# Patient Record
Sex: Female | Born: 1985 | Race: White | Hispanic: No | Marital: Married | Smoking: Former smoker
Health system: Southern US, Community
[De-identification: ages and names within clinical notes are randomized; demographics above are authoritative.]

## PROBLEM LIST (undated history)

## (undated) DIAGNOSIS — R87629 Unspecified abnormal cytological findings in specimens from vagina: Secondary | ICD-10-CM

## (undated) DIAGNOSIS — F1190 Opioid use, unspecified, uncomplicated: Secondary | ICD-10-CM

## (undated) DIAGNOSIS — D696 Thrombocytopenia, unspecified: Secondary | ICD-10-CM

## (undated) DIAGNOSIS — B009 Herpesviral infection, unspecified: Secondary | ICD-10-CM

## (undated) DIAGNOSIS — O24419 Gestational diabetes mellitus in pregnancy, unspecified control: Secondary | ICD-10-CM

## (undated) DIAGNOSIS — O99119 Other diseases of the blood and blood-forming organs and certain disorders involving the immune mechanism complicating pregnancy, unspecified trimester: Secondary | ICD-10-CM

## (undated) HISTORY — DX: Gestational diabetes mellitus in pregnancy, unspecified control: O24.419

## (undated) HISTORY — DX: Other diseases of the blood and blood-forming organs and certain disorders involving the immune mechanism complicating pregnancy, unspecified trimester: O99.119

## (undated) HISTORY — PX: LEEP: SHX91

## (undated) HISTORY — PX: EYE SURGERY: SHX253

## (undated) HISTORY — PX: WRIST SURGERY: SHX841

## (undated) HISTORY — DX: Unspecified abnormal cytological findings in specimens from vagina: R87.629

## (undated) HISTORY — DX: Herpesviral infection, unspecified: B00.9

## (undated) HISTORY — DX: Other diseases of the blood and blood-forming organs and certain disorders involving the immune mechanism complicating pregnancy, unspecified trimester: D69.6

## (undated) HISTORY — DX: Opioid use, unspecified, uncomplicated: F11.90

---

## 2010-05-29 DIAGNOSIS — G40909 Epilepsy, unspecified, not intractable, without status epilepticus: Secondary | ICD-10-CM | POA: Insufficient documentation

## 2017-10-28 ENCOUNTER — Other Ambulatory Visit: Payer: Self-pay | Admitting: Specialist

## 2017-10-28 DIAGNOSIS — G44229 Chronic tension-type headache, not intractable: Secondary | ICD-10-CM

## 2017-10-28 DIAGNOSIS — H532 Diplopia: Secondary | ICD-10-CM

## 2017-11-05 ENCOUNTER — Ambulatory Visit
Admission: RE | Admit: 2017-11-05 | Discharge: 2017-11-05 | Disposition: A | Discharge status: Home / Self Care | Payer: PRIVATE HEALTH INSURANCE | Source: Ambulatory Visit | Attending: Specialist | Admitting: Specialist

## 2017-11-05 DIAGNOSIS — H532 Diplopia: Secondary | ICD-10-CM

## 2017-11-05 DIAGNOSIS — G44229 Chronic tension-type headache, not intractable: Secondary | ICD-10-CM

## 2017-11-05 IMAGING — MR MR HEAD W/O CM
11 series · 48 of 48 positions shown · non-contrast
Comparison: None.

CLINICAL DATA: Blurred vision. Bilateral temporal lobe headaches.
Brain follow-up. Symptoms began [DATE]. Diagnosis with lines
disease 1 year previous. Chronic attention type headache, not
intractable. Diplopia.

EXAM:
MRI HEAD WITHOUT CONTRAST
TECHNIQUE: Multiplanar, multiecho pulse sequences of the brain and surrounding
structures were obtained without intravenous contrast.

[Series 2: T1 · sagittal · 5.0mm · 0.45mm/px · 2 of 25 slices shown]
[im 1/25]
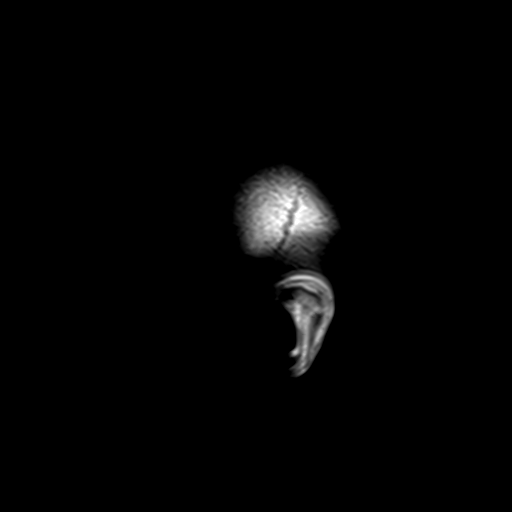
[im 25/25]
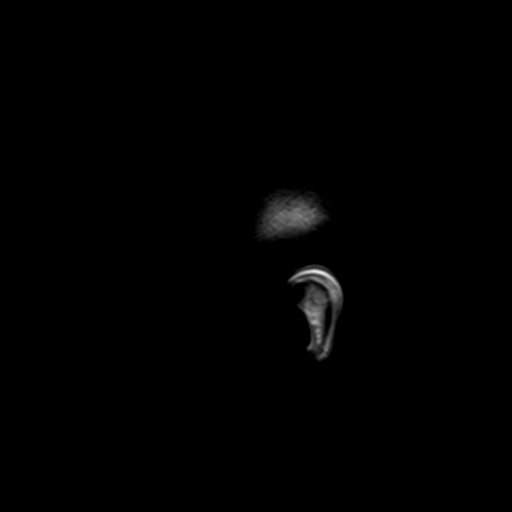

[Series 3: DWI · axial · 3.0mm · 1.80mm/px · z∈[-72,+86]mm · 8 of 108 slices shown (1 of 4)]
[im 1/108]
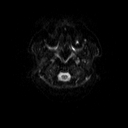
[im 16/108]
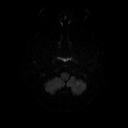
[im 31/108]
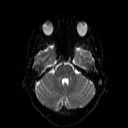
[im 46/108]
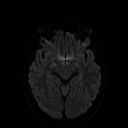
[im 62/108]
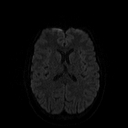
[im 77/108]
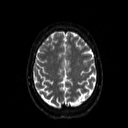
[im 92/108]
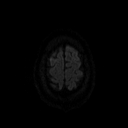
[im 108/108]
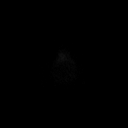

[Series 4: DWI · axial · 3.0mm · 1.80mm/px · z∈[-72,+86]mm · 4 of 54 slices shown (2 of 4)]
[im 1/54]
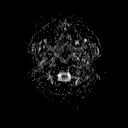
[im 18/54]
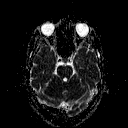
[im 36/54]
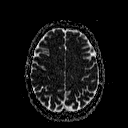
[im 54/54]
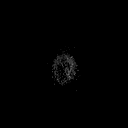

[Series 5: FLAIR · axial · 3.0mm · 0.45mm/px · z∈[-69,+83]mm · 3 of 34 slices shown]
[im 1/34]
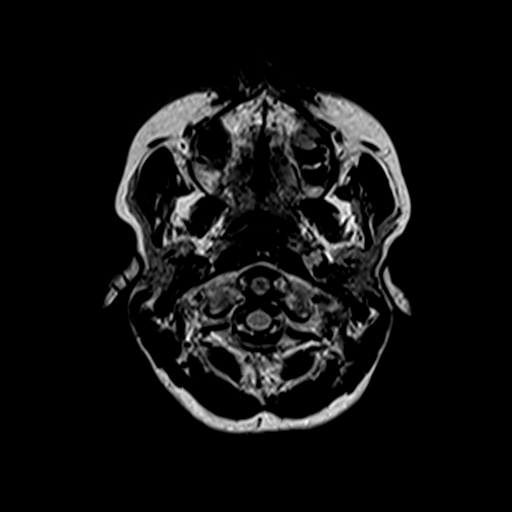
[im 17/34]
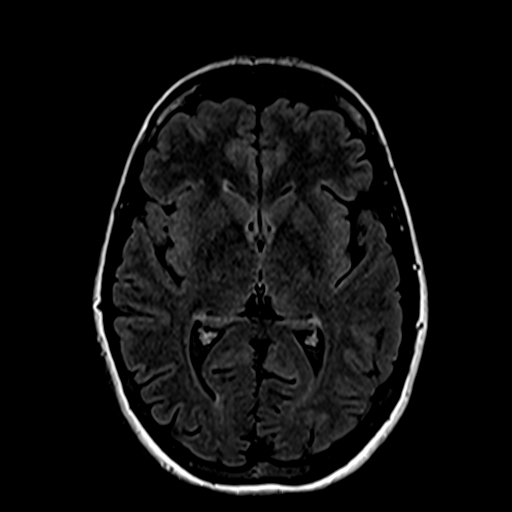
[im 34/34]
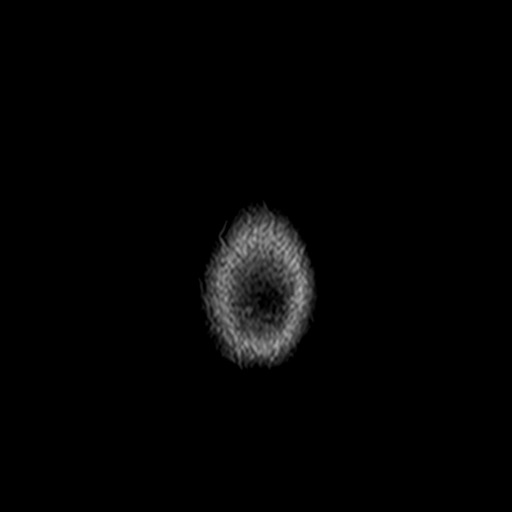

[Series 6: T2 · axial · 5.0mm · 0.60mm/px · z∈[-69,+85]mm · 2 of 24 slices shown (1 of 2)]
[im 1/24]
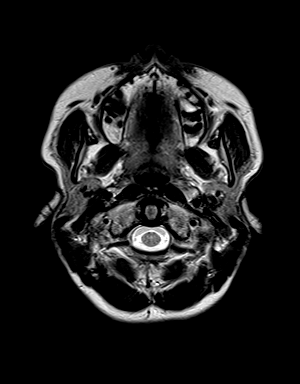
[im 24/24]
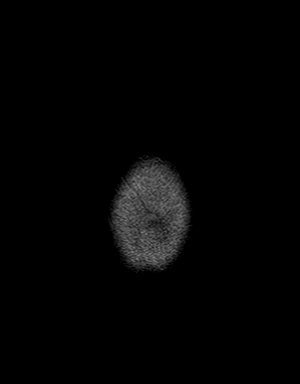

[Series 7: mip_images(sw) · axial · 40.0mm · 0.90mm/px · z∈[-52,+67]mm · 2 of 25 slices shown]
[im 1/25]
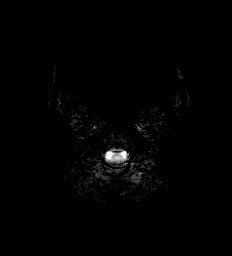
[im 25/25]
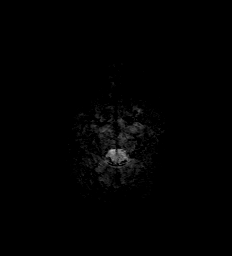

[Series 8: swi_images · axial · 5.0mm · 0.90mm/px · z∈[-69,+84]mm · 3 of 32 slices shown]
[im 1/32]
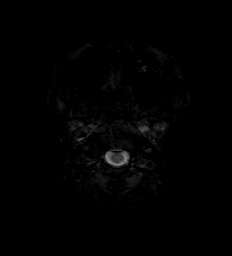
[im 16/32]
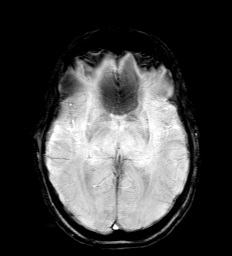
[im 32/32]
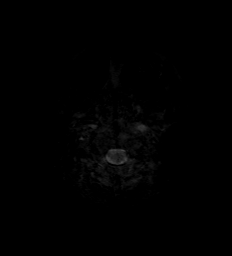

[Series 9: t1_mpr_tra · axial · 1.0mm · 0.72mm/px · z∈[-70,+87]mm · 13 of 160 slices shown]
[im 1/160]
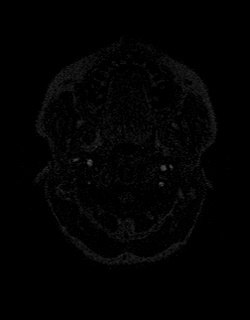
[im 14/160]
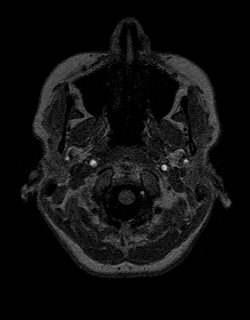
[im 27/160]
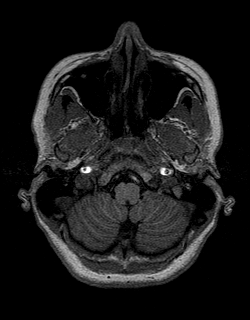
[im 40/160]
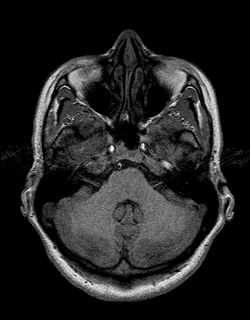
[im 54/160]
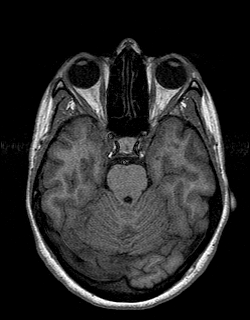
[im 67/160]
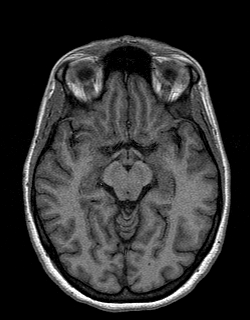
[im 80/160]
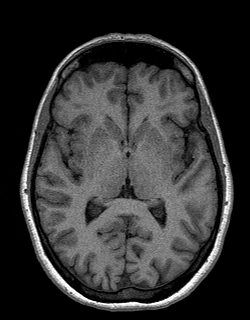
[im 93/160]
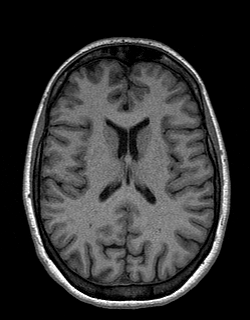
[im 107/160]
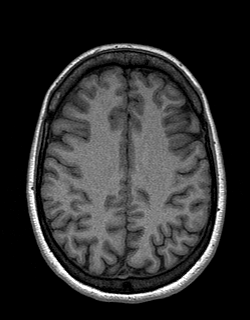
[im 120/160]
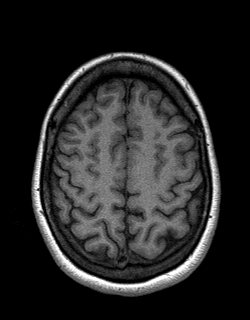
[im 133/160]
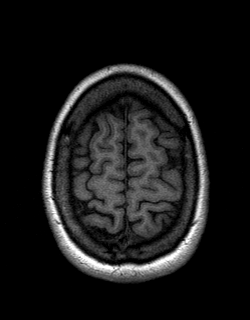
[im 146/160]
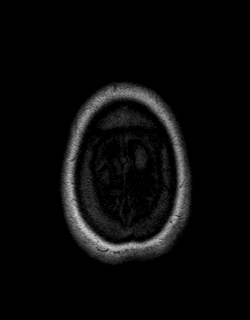
[im 160/160]
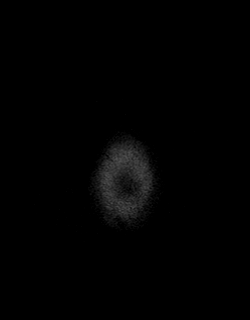

[Series 10: DWI · coronal · 5.0mm · 1.80mm/px · 6 of 80 slices shown (3 of 4)]
[im 1/80]
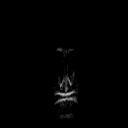
[im 16/80]
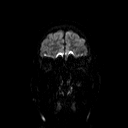
[im 32/80]
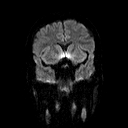
[im 48/80]
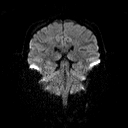
[im 64/80]
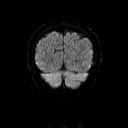
[im 80/80]
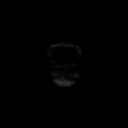

[Series 11: DWI · coronal · 5.0mm · 1.80mm/px · 3 of 40 slices shown (4 of 4)]
[im 1/40]
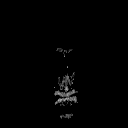
[im 20/40]
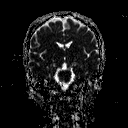
[im 40/40]
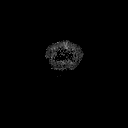

[Series 12: T2 · coronal · 5.0mm · 0.45mm/px · 2 of 31 slices shown (2 of 2)]
[im 1/31]
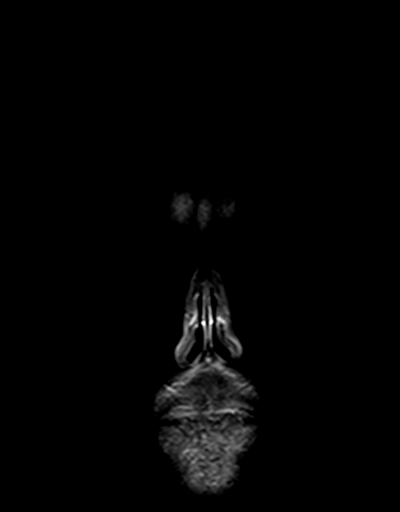
[im 31/31]
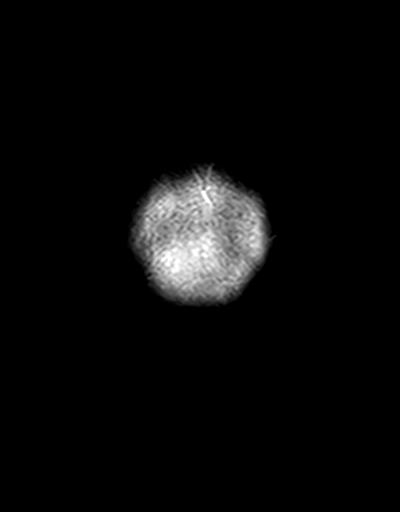

[48 of 48 positions shown; findings below may reference images not displayed]

FINDINGS: Brain: No acute infarct, hemorrhage, or mass lesion is present. No
significant extraaxial fluid collection is present. The ventricles
are of normal size. No significant white matter disease is present.

Vascular: Flow is present in the major intracranial arteries.

Skull and upper cervical spine: The skullbase is within normal
limits. The craniocervical junction is within normal limits. Midline
sagittal structures are unremarkable.

Sinuses/Orbits: A polyp or a this retention cyst is noted anteriorly
and inferiorly in the left maxillary sinus. The remaining paranasal
sinuses an the mastoid air cells are clear. Globes and orbits are
within normal limits bilaterally.
IMPRESSION: Negative MRI of the brain. No acute or focal lesion to explain the
patient's symptoms.

## 2018-12-26 DIAGNOSIS — A6000 Herpesviral infection of urogenital system, unspecified: Secondary | ICD-10-CM | POA: Insufficient documentation

## 2018-12-30 DIAGNOSIS — O99320 Drug use complicating pregnancy, unspecified trimester: Secondary | ICD-10-CM | POA: Insufficient documentation

## 2018-12-30 DIAGNOSIS — F1410 Cocaine abuse, uncomplicated: Secondary | ICD-10-CM | POA: Insufficient documentation

## 2018-12-30 DIAGNOSIS — F1111 Opioid abuse, in remission: Secondary | ICD-10-CM | POA: Insufficient documentation

## 2019-04-07 DIAGNOSIS — O99810 Abnormal glucose complicating pregnancy: Secondary | ICD-10-CM | POA: Insufficient documentation

## 2019-04-10 DIAGNOSIS — D508 Other iron deficiency anemias: Secondary | ICD-10-CM | POA: Insufficient documentation

## 2019-04-10 DIAGNOSIS — D696 Thrombocytopenia, unspecified: Secondary | ICD-10-CM | POA: Insufficient documentation

## 2019-12-10 ENCOUNTER — Encounter (HOSPITAL_COMMUNITY): Payer: Self-pay | Admitting: Emergency Medicine

## 2019-12-10 ENCOUNTER — Other Ambulatory Visit: Payer: Self-pay

## 2019-12-10 ENCOUNTER — Emergency Department (HOSPITAL_COMMUNITY)
Admission: EM | Admit: 2019-12-10 | Discharge: 2019-12-11 | Discharge status: Against Medical Advice | Payer: Medicaid Other | Attending: Emergency Medicine | Admitting: Emergency Medicine

## 2019-12-10 DIAGNOSIS — Y929 Unspecified place or not applicable: Secondary | ICD-10-CM | POA: Diagnosis not present

## 2019-12-10 DIAGNOSIS — S52501A Unspecified fracture of the lower end of right radius, initial encounter for closed fracture: Secondary | ICD-10-CM

## 2019-12-10 DIAGNOSIS — S59911A Unspecified injury of right forearm, initial encounter: Secondary | ICD-10-CM | POA: Diagnosis present

## 2019-12-10 DIAGNOSIS — Y999 Unspecified external cause status: Secondary | ICD-10-CM | POA: Diagnosis not present

## 2019-12-10 DIAGNOSIS — S52591A Other fractures of lower end of right radius, initial encounter for closed fracture: Secondary | ICD-10-CM | POA: Diagnosis not present

## 2019-12-10 DIAGNOSIS — W109XXA Fall (on) (from) unspecified stairs and steps, initial encounter: Secondary | ICD-10-CM | POA: Insufficient documentation

## 2019-12-10 DIAGNOSIS — S52691A Other fracture of lower end of right ulna, initial encounter for closed fracture: Secondary | ICD-10-CM | POA: Insufficient documentation

## 2019-12-10 DIAGNOSIS — Y939 Activity, unspecified: Secondary | ICD-10-CM | POA: Insufficient documentation

## 2019-12-10 DIAGNOSIS — Z5320 Procedure and treatment not carried out because of patient's decision for unspecified reasons: Secondary | ICD-10-CM | POA: Insufficient documentation

## 2019-12-10 DIAGNOSIS — S52601A Unspecified fracture of lower end of right ulna, initial encounter for closed fracture: Secondary | ICD-10-CM

## 2019-12-10 MED ORDER — OXYCODONE-ACETAMINOPHEN 5-325 MG PO TABS
1.0000 | ORAL_TABLET | Freq: Once | ORAL | Status: AC
  Administered 2019-12-10: 1 via ORAL
  Filled 2019-12-10: qty 1

## 2019-12-10 NOTE — ED Triage Notes (Signed)
Patient missed her step and fell at home today , seen at [facility] ER , presents with right forearm splint with sling , sustained fracture at right wrist , reports pain at right wrist .

## 2019-12-11 ENCOUNTER — Emergency Department (HOSPITAL_COMMUNITY): Payer: Medicaid Other

## 2019-12-11 DIAGNOSIS — S52509A Unspecified fracture of the lower end of unspecified radius, initial encounter for closed fracture: Secondary | ICD-10-CM | POA: Insufficient documentation

## 2019-12-11 MED ORDER — ACETAMINOPHEN 500 MG PO TABS
1000.0000 mg | ORAL_TABLET | Freq: Once | ORAL | Status: AC
  Administered 2019-12-11: 1000 mg via ORAL
  Filled 2019-12-11: qty 2

## 2019-12-11 NOTE — ED Provider Notes (Signed)
[facility] EMERGENCY DEPARTMENT Provider Note   CSN: [id number] Arrival date & time: 12/10/19  2044     History Chief Complaint  Patient presents with  . Wrist Fracture    Aimee Guzman is a 34 y.o. female with a history of opioid use disorder on buprenorphine who presents to the emergency department with a chief complaint of fall.  The patient reports that she fell down she tripped and fell down a flight of stairs yesterday and landed on her right side.  She reports that her right arm was outstretched.  She denies hitting her head, nausea, vomiting, or syncope.  The patient went to [facility] ER and sustained comminuted, displaced, nonarticular fractures of the distal right radius and ulna.  She reports that she was supposed to go to [facility] as she might need to have surgery.  She reports that her grandfather was driving her to [place], but was unable to drive her to [facility] because it was too far.  She reports that her husband then drove her to the ER tonight.  Per chart review, it appears that orthopedics was consulted by [facility] who had requested the patient to be transferred to [facility] for fracture reduction and splint application.  The patient does have a splint and sling in place in the ER tonight.  She reports that she was taking buprenorphine, but has stopped this medication.  She is unable to tell me when she stopped taking this medication.  She is requesting medication for pain.  The history is provided by the patient. No language interpreter was used.       History reviewed. No pertinent past medical history.  There are no problems to display for this patient.   History reviewed. No pertinent surgical history.   OB History   No obstetric history on file.     No family history on file.  Social History   Tobacco Use  . Smoking status: Never Smoker  . Smokeless tobacco: Never Used  Substance Use Topics  . Alcohol use: Never  . Drug use: Never    Home  Medications Prior to Admission medications   Not on File    Allergies    Patient has no known allergies.  Review of Systems   Review of Systems  Constitutional: Negative for activity change, chills and fever.  Respiratory: Negative for shortness of breath.   Cardiovascular: Negative for chest pain.  Gastrointestinal: Negative for abdominal pain, diarrhea, nausea and vomiting.  Genitourinary: Negative for dysuria.  Musculoskeletal: Positive for arthralgias and myalgias. Negative for back pain, gait problem, neck pain and neck stiffness.  Skin: Negative for rash.  Allergic/Immunologic: Negative for immunocompromised state.  Neurological: Negative for dizziness, seizures, syncope, weakness and headaches.  Psychiatric/Behavioral: Negative for confusion.    Physical Exam Updated Vital Signs BP 124/84   Pulse 84   Temp 98.5 F (36.9 C) (Oral)   Resp 18   LMP 11/30/2019   SpO2 100%   Physical Exam Vitals and nursing note reviewed.  Constitutional:      General: She is not in acute distress. HENT:     Head: Normocephalic.  Eyes:     Conjunctiva/sclera: Conjunctivae normal.  Cardiovascular:     Rate and Rhythm: Normal rate and regular rhythm.     Heart sounds: No murmur. No friction rub. No gallop.   Pulmonary:     Effort: Pulmonary effort is normal. No respiratory distress.  Abdominal:     General: There is no distension.  Palpations: Abdomen is soft.  Musculoskeletal:     Cervical back: Neck supple.     Comments: Splint in place to the right arm.  Radial pulses are 2+ and palpable.  Good capillary refill of the digits of the right hand.  Sensation is intact and equal to all 4 of the distal aspects digits of the right hand.  Skin:    General: Skin is warm.     Findings: No rash.  Neurological:     Mental Status: She is alert.  Psychiatric:        Behavior: Behavior normal.     ED Results / Procedures / Treatments   Labs (all labs ordered are listed, but only  abnormal results are displayed) Labs Reviewed - No data to display  EKG None  Radiology No results found.  Procedures Procedures (including critical care time)  Medications Ordered in ED Medications  oxyCODONE-acetaminophen (PERCOCET/ROXICET) 5-325 MG per tablet 1 tablet (1 tablet Oral Given 12/10/19 2116)  acetaminophen (TYLENOL) tablet 1,000 mg (1,000 mg Oral Given 12/11/19 0201)    ED Course  I have reviewed the triage vital signs and the nursing notes.  Pertinent labs & imaging results that were available during my care of the patient were reviewed by me and considered in my medical decision making (see chart for details).  Clinical Course as of Dec 10 313  Mon Dec 11, 2019  0300 Notified by nursing staff that patient has eloped from the department.  Unfortunately, I was unable to discuss concerns for leaving AMA prior to elopement.   [MM]    Clinical Course User Index [MM] Fishel Wamble, Coral Else, PA-C   MDM Rules/Calculators/A&P                      34 year old female with a history of opioid use disorder on buprenorphine who presents to the emergency department with a chief complaint of fall.  The patient tripped and fell down the stairs yesterday and was seen at [facility] ER where she was found to have comminuted, nonarticular, displaced fractures of the right distal radius and ulna.  She was supposed to be transferred by POV to [facility] ER for fracture reduction and splint application, but states that she did not have transportation to the [facility].   Medical records from [facility] ER have been reviewed.  Impressions of the images are available in care everywhere, but the images are not able to be viewed.  Will reorder right wrist x-ray and plan to consult orthopedics following imaging.  I discussed with the patient that this will take some time since orthopedics is not aware of the patient and we do not have images available for viewing.    Will order Tylenol for pain control as the patient  is requesting pain medication after discussing the patient with Dr. Daun Peacock, attending physician.  We discussed her buprenorphine, which she had filled on January 5 for 30 days.  She states that she is no longer taking this medication, but is unable to tell me how long she has not been taking it.  Per [place] controlled substance database, she has been getting the medication filled regularly since February 2020.  Notified by nursing staff that the patient eloped from the department. I was unable to discuss my concerns as a provider and the possibility that this may worsen due to elopement. We were unable to discuss the nature, risks and benefits, and alternatives to treatment. I was also unable tospecifically discussed that  without further evaluation I cannot guarantee there is not a life threatening event occuring.    Final Clinical Impression(s) / ED Diagnoses Final diagnoses:  Closed fracture of distal end of right radius, unspecified fracture morphology, initial encounter  Closed fracture of distal end of right ulna, unspecified fracture morphology, initial encounter    Rx / DC Orders ED Discharge Orders    None       Barkley Boards, PA-C 12/11/19 0315    Palumbo, April, MD 12/11/19 0330

## 2019-12-11 NOTE — ED Notes (Signed)
Patient transported to X-ray 

## 2020-01-30 DIAGNOSIS — S52551D Other extraarticular fracture of lower end of right radius, subsequent encounter for closed fracture with routine healing: Secondary | ICD-10-CM | POA: Diagnosis not present

## 2020-01-30 DIAGNOSIS — S52691D Other fracture of lower end of right ulna, subsequent encounter for closed fracture with routine healing: Secondary | ICD-10-CM | POA: Diagnosis not present

## 2020-02-22 DIAGNOSIS — R41840 Attention and concentration deficit: Secondary | ICD-10-CM | POA: Diagnosis not present

## 2020-02-22 DIAGNOSIS — F419 Anxiety disorder, unspecified: Secondary | ICD-10-CM | POA: Diagnosis not present

## 2020-02-22 DIAGNOSIS — G44229 Chronic tension-type headache, not intractable: Secondary | ICD-10-CM | POA: Diagnosis not present

## 2020-02-27 DIAGNOSIS — S52551D Other extraarticular fracture of lower end of right radius, subsequent encounter for closed fracture with routine healing: Secondary | ICD-10-CM | POA: Diagnosis not present

## 2020-02-27 DIAGNOSIS — S52691D Other fracture of lower end of right ulna, subsequent encounter for closed fracture with routine healing: Secondary | ICD-10-CM | POA: Diagnosis not present

## 2020-03-04 DIAGNOSIS — Z23 Encounter for immunization: Secondary | ICD-10-CM | POA: Diagnosis not present

## 2020-03-15 DIAGNOSIS — G894 Chronic pain syndrome: Secondary | ICD-10-CM | POA: Diagnosis not present

## 2020-03-25 DIAGNOSIS — Z23 Encounter for immunization: Secondary | ICD-10-CM | POA: Diagnosis not present

## 2020-07-25 DIAGNOSIS — R41840 Attention and concentration deficit: Secondary | ICD-10-CM | POA: Diagnosis not present

## 2020-07-25 DIAGNOSIS — Z760 Encounter for issue of repeat prescription: Secondary | ICD-10-CM | POA: Diagnosis not present

## 2020-07-25 DIAGNOSIS — G44229 Chronic tension-type headache, not intractable: Secondary | ICD-10-CM | POA: Diagnosis not present

## 2020-07-25 DIAGNOSIS — F419 Anxiety disorder, unspecified: Secondary | ICD-10-CM | POA: Diagnosis not present

## 2020-08-13 DIAGNOSIS — F1120 Opioid dependence, uncomplicated: Secondary | ICD-10-CM | POA: Diagnosis not present

## 2020-09-27 DIAGNOSIS — F1120 Opioid dependence, uncomplicated: Secondary | ICD-10-CM | POA: Diagnosis not present

## 2020-09-30 DIAGNOSIS — F1120 Opioid dependence, uncomplicated: Secondary | ICD-10-CM | POA: Diagnosis not present

## 2020-11-06 DIAGNOSIS — F1120 Opioid dependence, uncomplicated: Secondary | ICD-10-CM | POA: Diagnosis not present

## 2020-11-14 DIAGNOSIS — Z01419 Encounter for gynecological examination (general) (routine) without abnormal findings: Secondary | ICD-10-CM | POA: Diagnosis not present

## 2020-11-20 DIAGNOSIS — F1120 Opioid dependence, uncomplicated: Secondary | ICD-10-CM | POA: Diagnosis not present

## 2020-12-26 DIAGNOSIS — G44229 Chronic tension-type headache, not intractable: Secondary | ICD-10-CM | POA: Diagnosis not present

## 2020-12-26 DIAGNOSIS — R41840 Attention and concentration deficit: Secondary | ICD-10-CM | POA: Diagnosis not present

## 2020-12-26 DIAGNOSIS — F419 Anxiety disorder, unspecified: Secondary | ICD-10-CM | POA: Diagnosis not present

## 2020-12-26 DIAGNOSIS — Z760 Encounter for issue of repeat prescription: Secondary | ICD-10-CM | POA: Diagnosis not present

## 2021-02-28 DIAGNOSIS — Z8639 Personal history of other endocrine, nutritional and metabolic disease: Secondary | ICD-10-CM | POA: Diagnosis not present

## 2021-02-28 DIAGNOSIS — Z8619 Personal history of other infectious and parasitic diseases: Secondary | ICD-10-CM | POA: Diagnosis not present

## 2021-02-28 DIAGNOSIS — Z1331 Encounter for screening for depression: Secondary | ICD-10-CM | POA: Diagnosis not present

## 2021-02-28 DIAGNOSIS — Z0000 Encounter for general adult medical examination without abnormal findings: Secondary | ICD-10-CM | POA: Diagnosis not present

## 2021-03-07 DIAGNOSIS — Z8619 Personal history of other infectious and parasitic diseases: Secondary | ICD-10-CM | POA: Diagnosis not present

## 2021-04-29 DIAGNOSIS — J029 Acute pharyngitis, unspecified: Secondary | ICD-10-CM | POA: Diagnosis not present

## 2021-04-29 DIAGNOSIS — R0981 Nasal congestion: Secondary | ICD-10-CM | POA: Diagnosis not present

## 2021-05-08 DIAGNOSIS — J0120 Acute ethmoidal sinusitis, unspecified: Secondary | ICD-10-CM | POA: Diagnosis not present

## 2021-05-15 DIAGNOSIS — G44229 Chronic tension-type headache, not intractable: Secondary | ICD-10-CM | POA: Diagnosis not present

## 2021-05-15 DIAGNOSIS — F419 Anxiety disorder, unspecified: Secondary | ICD-10-CM | POA: Diagnosis not present

## 2021-05-15 DIAGNOSIS — Z760 Encounter for issue of repeat prescription: Secondary | ICD-10-CM | POA: Diagnosis not present

## 2021-05-15 DIAGNOSIS — R41840 Attention and concentration deficit: Secondary | ICD-10-CM | POA: Diagnosis not present

## 2021-07-12 DIAGNOSIS — F1120 Opioid dependence, uncomplicated: Secondary | ICD-10-CM | POA: Diagnosis not present

## 2021-07-22 DIAGNOSIS — J0100 Acute maxillary sinusitis, unspecified: Secondary | ICD-10-CM | POA: Diagnosis not present

## 2021-08-12 DIAGNOSIS — F1120 Opioid dependence, uncomplicated: Secondary | ICD-10-CM | POA: Diagnosis not present

## 2021-08-18 DIAGNOSIS — Z3689 Encounter for other specified antenatal screening: Secondary | ICD-10-CM | POA: Diagnosis not present

## 2021-08-18 DIAGNOSIS — O99321 Drug use complicating pregnancy, first trimester: Secondary | ICD-10-CM | POA: Diagnosis not present

## 2021-08-18 DIAGNOSIS — Z23 Encounter for immunization: Secondary | ICD-10-CM | POA: Diagnosis not present

## 2021-08-18 DIAGNOSIS — F1290 Cannabis use, unspecified, uncomplicated: Secondary | ICD-10-CM | POA: Diagnosis not present

## 2021-08-19 DIAGNOSIS — O3680X0 Pregnancy with inconclusive fetal viability, not applicable or unspecified: Secondary | ICD-10-CM | POA: Diagnosis not present

## 2021-08-19 DIAGNOSIS — Z3A01 Less than 8 weeks gestation of pregnancy: Secondary | ICD-10-CM | POA: Diagnosis not present

## 2021-09-01 DIAGNOSIS — F1120 Opioid dependence, uncomplicated: Secondary | ICD-10-CM | POA: Diagnosis not present

## 2021-09-02 DIAGNOSIS — Z3689 Encounter for other specified antenatal screening: Secondary | ICD-10-CM | POA: Diagnosis not present

## 2021-09-02 LAB — OB RESULTS CONSOLE RUBELLA ANTIBODY, IGM: Rubella: IMMUNE

## 2021-10-10 DIAGNOSIS — F1120 Opioid dependence, uncomplicated: Secondary | ICD-10-CM | POA: Diagnosis not present

## 2021-10-13 DIAGNOSIS — R350 Frequency of micturition: Secondary | ICD-10-CM | POA: Diagnosis not present

## 2021-10-16 DIAGNOSIS — F1120 Opioid dependence, uncomplicated: Secondary | ICD-10-CM | POA: Diagnosis not present

## 2021-10-31 DIAGNOSIS — F1120 Opioid dependence, uncomplicated: Secondary | ICD-10-CM | POA: Diagnosis not present

## 2021-11-13 DIAGNOSIS — Z369 Encounter for antenatal screening, unspecified: Secondary | ICD-10-CM | POA: Diagnosis not present

## 2021-11-13 DIAGNOSIS — F1111 Opioid abuse, in remission: Secondary | ICD-10-CM | POA: Diagnosis not present

## 2021-11-13 DIAGNOSIS — O4402 Placenta previa specified as without hemorrhage, second trimester: Secondary | ICD-10-CM | POA: Insufficient documentation

## 2021-11-13 DIAGNOSIS — Z3A18 18 weeks gestation of pregnancy: Secondary | ICD-10-CM | POA: Diagnosis not present

## 2021-11-13 DIAGNOSIS — O4442 Low lying placenta NOS or without hemorrhage, second trimester: Secondary | ICD-10-CM | POA: Insufficient documentation

## 2021-11-13 DIAGNOSIS — O3503X0 Maternal care for (suspected) central nervous system malformation or damage in fetus, choroid plexus cysts, not applicable or unspecified: Secondary | ICD-10-CM | POA: Diagnosis not present

## 2021-11-13 DIAGNOSIS — Z3689 Encounter for other specified antenatal screening: Secondary | ICD-10-CM | POA: Diagnosis not present

## 2021-11-14 DIAGNOSIS — F1120 Opioid dependence, uncomplicated: Secondary | ICD-10-CM | POA: Diagnosis not present

## 2021-11-25 DIAGNOSIS — R41840 Attention and concentration deficit: Secondary | ICD-10-CM | POA: Diagnosis not present

## 2021-11-25 DIAGNOSIS — Z760 Encounter for issue of repeat prescription: Secondary | ICD-10-CM | POA: Diagnosis not present

## 2021-11-25 DIAGNOSIS — F419 Anxiety disorder, unspecified: Secondary | ICD-10-CM | POA: Diagnosis not present

## 2021-11-25 DIAGNOSIS — G44229 Chronic tension-type headache, not intractable: Secondary | ICD-10-CM | POA: Diagnosis not present

## 2021-11-28 DIAGNOSIS — F1120 Opioid dependence, uncomplicated: Secondary | ICD-10-CM | POA: Diagnosis not present

## 2021-11-30 NOTE — L&D Delivery Note (Addendum)
Delivery Note:  ? 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   ? ?First Stage: ? ?Induction of labor: 04/08/22 initiated with cytotec @ 1730.  ?Onset of labor: 04/09/22 at 0902 ?Augmentation: AROM, Pitocin, and Cytotec ?ROM: AROM by CNM at 0902 ?Active labor onset: 04/09/22 at 1020 AM  ?Analgesia /Anesthesia/Pain control intrapartum: Epidural  ? ?Second Stage: ? ?Complete dilation at 04/09/2022  1045 AM ?Onset of pushing at 1045 AM ?FHR second stage: Category II variables with quick return to baseline; Prolonged x1 ? ? ?CNM to bedside for re-occurring variable decelerations with contractions. SVE revealed Aimee Guzman to be complete at 0 station. Pushing initiated immediately with CNM and RNs at bedside. Pt Pushing in lithotomy position with good maternal effort. Fetal head progressing to +1 station with pushing efforts. CNM requested [place] to bedside. Pt repositioned to Right tilt and resting between contractions.   position with CNM and L&D staff support at bedside. Dr. [name] and Dr. [name] to at bedside for delivery. FHT in prolonged decel down to 80's. Dr. [name] assessed position of the baby and called for a vacuum delivery. NICU requested to bedside.  FOB present for birth and supportive. ?Nuchal Cord: No  ?Delivery of a Live born female  ?Birth Weight:   ?APGAR: 8, 9 ? ?Newborn Delivery   ?Birth date/time: 04/09/2022 11:02:00 ?Delivery type: Vaginal, Vacuum (Extractor) ?  ?  ? Please see Dr. [name] Note below for details of delivery.  ?APGAR: 5 min:  8  ?APGAR:10 min-  9 ? ?CNM Cord double clamped 1 minute of life and cut by FOB.  ?Collection of cord blood for typing completed. ?Cord blood donation-None  ?Arterial cord blood sample-No   ? ?Third Stage: ? ?Brisk vaginal bleeding noted prior to placental separation. TXA ordered and initiated last platelet count was 94.  ?With LUS  massage and gentle cord traction [name] Placenta delivered-Spontaneous  with 3 vessels . ?Uterine tone firmed with massage bleeding decreased to minimal.   ?Uterotonics: TXA and Pitocin Bolus  ?Placenta to L&D for disposal. ? ?2nd degree;Periurethral  laceration identified.  ?Episiotomy:None  ?Local analgesia: N/A  ? ?Repair: 2nd degree repaired in traditional fashion with 3.0 vicryl. Left Periurethral identified and hemostatic. Not Repaired  ?Est. Blood Loss (mL):500.00   ?Complications: None ? ? ?Mom to postpartum.  Baby girl "Aimee Guzman" to Couplet care / Skin to Skin. ? ?Delivery Report: ? ?Review the Delivery Report for details.   ? ?[name]) [name], MSN, CNM  ?Center for [facility]  ?04/09/22 11:12AM ? ?Faculty practice Vacuum Assisted Vaginal Delivery Note ? ?Indication for operative vaginal delivery: Fetal bradycardia ? ?Patient was examined and found to be fully dilated with fetal station of +1 .  Patient's bladder was noted to be empty, and there were no known fetal contraindications to operative vaginal delivery. EFW was 59%ile by [place]/recent ultrasound.  FHR tracing remarkable for persistent recurrent variable decelerations followed by prolonged decelerations in the 90s. ? ?Risks of vacuum assistance were discussed in detail, including but not limited to, bleeding, infection, damage to maternal tissues, fetal cephalohematoma, inability to effect vaginal delivery of the head or shoulder dystocia that cannot be resolved by established maneuvers and need for emergency cesarean section.  Patient gave verbal consent. ? ?The soft vacuum cup was positioned over the sagittal suture 3 cm anterior to posterior fontanelle.  Pressure  was then increased to 500 mmHg, and the patient was instructed to push.  Pulling was administered along the pelvic curve while patient was pushing; there were 2 contractions and 0 popoffs.  Vacuum was reduced in between contractions.  The infant head was then delivered  atraumatically. At that time anterior shoulder was appreciated to be behind pelvic bone and a shoulder dystocia was diagnosed. At that time McRoberts maneuver as well as suprapubic pressure was done and simultaneously the baby's posterior axilla was palpated and posterior shoulder was delivered with the body shortly following. Noted to be a viable female infant, Apgars of ___ and ___.   ? ? ?Neonatology present for delivery.  There was spontaneous placental delivery, intact with three-vessel cord.   ? ?Vacuum delivery done by Dr. [name] and Dr. [name] present in room for delivery and directly supervised. ? ?Care then passed on to [name], [place] as documented below. ? ?Second degree perineal laceration noted requiring repair with 3.0 vicryl in the usual fashion. EBL 500cc, epidural anesthesia.  Sponge, instrument and needle counts were correct x 2.   ? ?Patient was given TXA at delivery for brisk bleeding as well as known gestational thrombocytopenia (Plt 94). ? ?The patient and baby were stable after delivery and remained in couplet care, with plans to transfer later to postpartum unit ? ?[name], MD, MPH ?OB Fellow, Faculty Practice ?Center for [employer], [facility] ? ?

## 2021-12-11 DIAGNOSIS — Z362 Encounter for other antenatal screening follow-up: Secondary | ICD-10-CM | POA: Diagnosis not present

## 2021-12-11 DIAGNOSIS — Z363 Encounter for antenatal screening for malformations: Secondary | ICD-10-CM | POA: Diagnosis not present

## 2021-12-11 DIAGNOSIS — Z3A22 22 weeks gestation of pregnancy: Secondary | ICD-10-CM | POA: Diagnosis not present

## 2021-12-11 DIAGNOSIS — O359XX0 Maternal care for (suspected) fetal abnormality and damage, unspecified, not applicable or unspecified: Secondary | ICD-10-CM | POA: Diagnosis not present

## 2021-12-11 DIAGNOSIS — O3503X0 Maternal care for (suspected) central nervous system malformation or damage in fetus, choroid plexus cysts, not applicable or unspecified: Secondary | ICD-10-CM | POA: Diagnosis not present

## 2021-12-11 DIAGNOSIS — Z3689 Encounter for other specified antenatal screening: Secondary | ICD-10-CM | POA: Diagnosis not present

## 2021-12-11 DIAGNOSIS — O4442 Low lying placenta NOS or without hemorrhage, second trimester: Secondary | ICD-10-CM | POA: Diagnosis not present

## 2021-12-12 DIAGNOSIS — F1120 Opioid dependence, uncomplicated: Secondary | ICD-10-CM | POA: Diagnosis not present

## 2021-12-26 DIAGNOSIS — F1120 Opioid dependence, uncomplicated: Secondary | ICD-10-CM | POA: Diagnosis not present

## 2021-12-31 ENCOUNTER — Telehealth: Payer: Self-pay

## 2021-12-31 DIAGNOSIS — O0990 Supervision of high risk pregnancy, unspecified, unspecified trimester: Secondary | ICD-10-CM

## 2022-01-01 NOTE — Telephone Encounter (Addendum)
Referral records sent to [facility] office. Per chart review, pt has history of OUD and is currently taking Naltrexone. Patient reports following with [facility] for Naltrexone. Reports taking Suboxone up until positive pregnancy test, reports this was approx August 2022. States she had a negative experience with first pregnancy; states she felt very shamed during prenatal care and delivery due to OUD. Recommended patient follow up with [place] location. Explained that we have experience with pregnant women with OUD and our providers are very supportive. New OB scheduled. Pt needs follow up US due to low lying placenta. [place] scheduled with MFM. Pt notified via [place].

## 2022-01-09 ENCOUNTER — Encounter: Payer: Self-pay | Admitting: *Deleted

## 2022-01-09 DIAGNOSIS — F1120 Opioid dependence, uncomplicated: Secondary | ICD-10-CM | POA: Diagnosis not present

## 2022-01-15 ENCOUNTER — Ambulatory Visit: Payer: BC Managed Care – PPO | Attending: Family Medicine

## 2022-01-15 ENCOUNTER — Other Ambulatory Visit: Payer: Self-pay

## 2022-01-15 ENCOUNTER — Other Ambulatory Visit: Payer: Self-pay | Admitting: *Deleted

## 2022-01-15 ENCOUNTER — Ambulatory Visit: Payer: BC Managed Care – PPO | Admitting: *Deleted

## 2022-01-15 ENCOUNTER — Encounter: Payer: Self-pay | Admitting: *Deleted

## 2022-01-15 VITALS — BP 106/62 | HR 88 | Ht 66.0 in

## 2022-01-15 DIAGNOSIS — O99323 Drug use complicating pregnancy, third trimester: Secondary | ICD-10-CM

## 2022-01-15 DIAGNOSIS — F1120 Opioid dependence, uncomplicated: Secondary | ICD-10-CM

## 2022-01-15 DIAGNOSIS — Z3493 Encounter for supervision of normal pregnancy, unspecified, third trimester: Secondary | ICD-10-CM

## 2022-01-15 DIAGNOSIS — O0990 Supervision of high risk pregnancy, unspecified, unspecified trimester: Secondary | ICD-10-CM | POA: Diagnosis not present

## 2022-01-15 DIAGNOSIS — O4440 Low lying placenta NOS or without hemorrhage, unspecified trimester: Secondary | ICD-10-CM | POA: Insufficient documentation

## 2022-01-15 IMAGING — US US MFM OB DETAIL+14 WK
1 series · 13 of 28 positions shown · non-contrast
Comparison: none

[Series 1: us mfm ob detail+14 wk · 136 acquisitions, 13 frames shown]
[im 6/136]
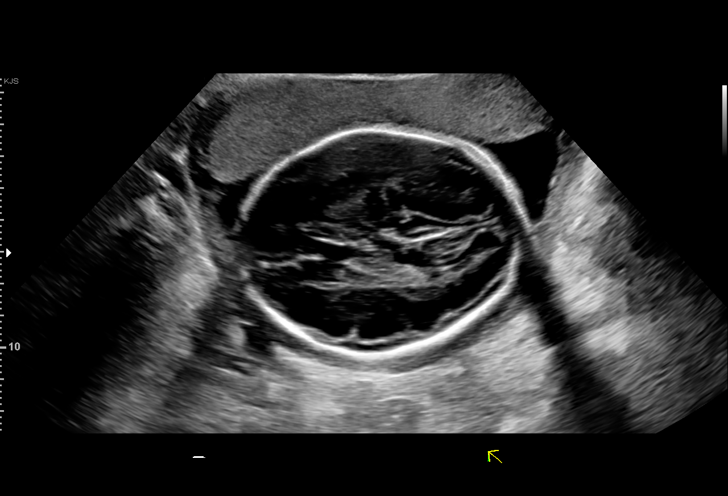
[im 16/136]
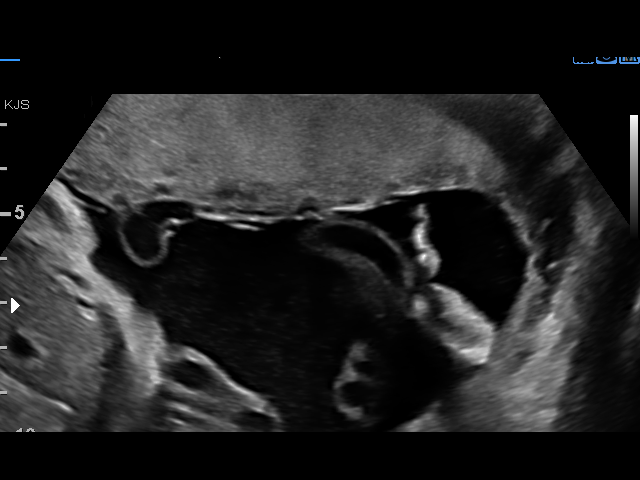
[im 26/136]
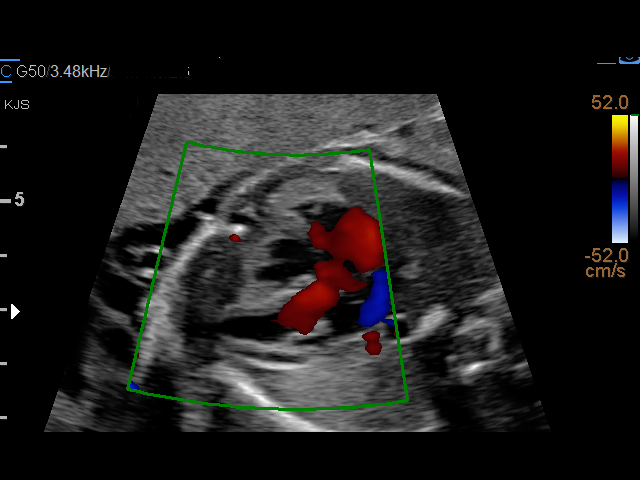
[im 36/136]
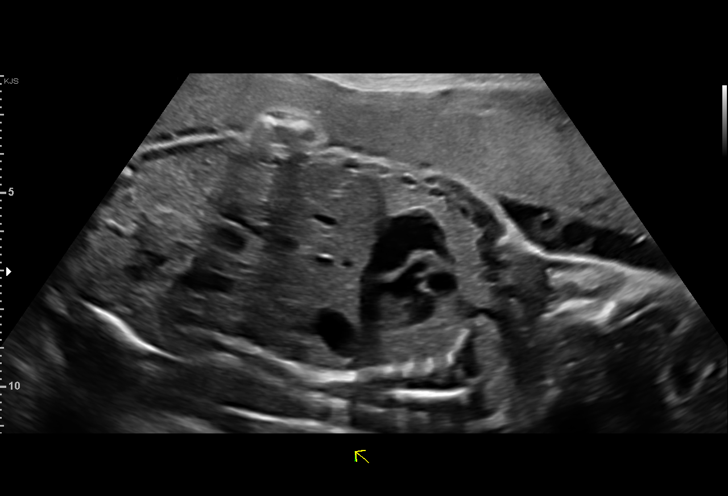
[im 46/136]
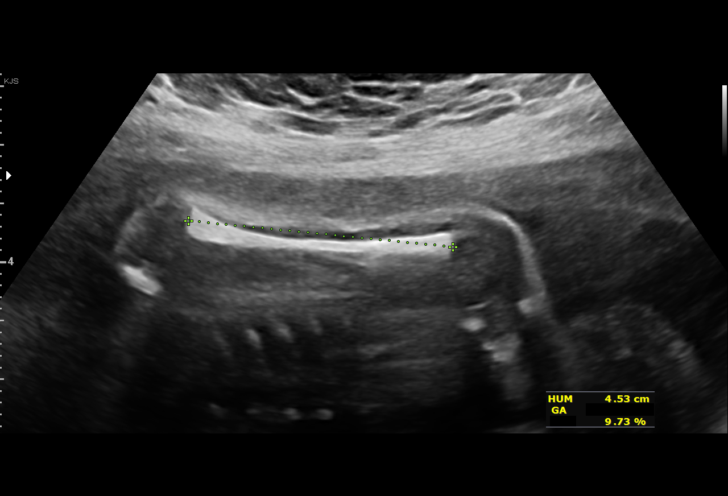
[im 56/136]
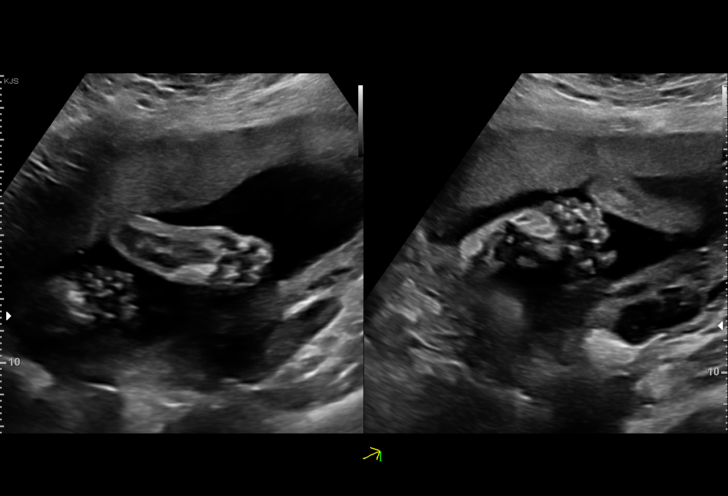
[im 71/136]
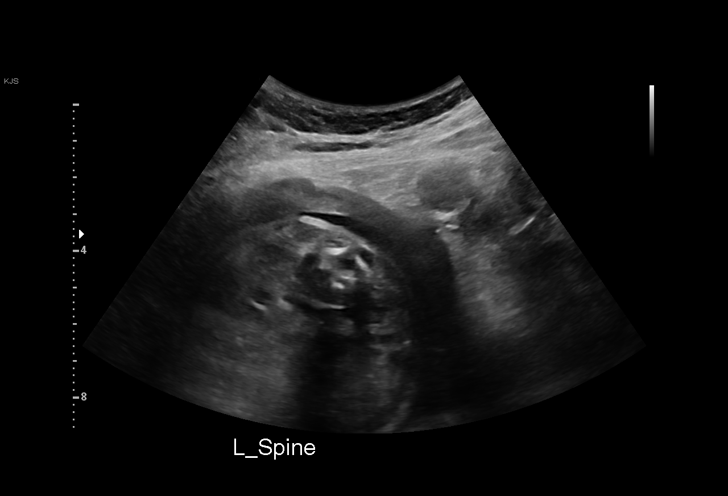
[im 81/136]
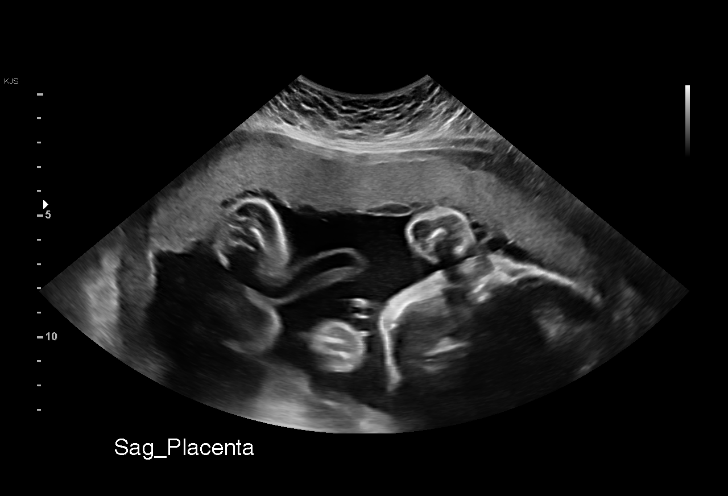
[im 91/136]
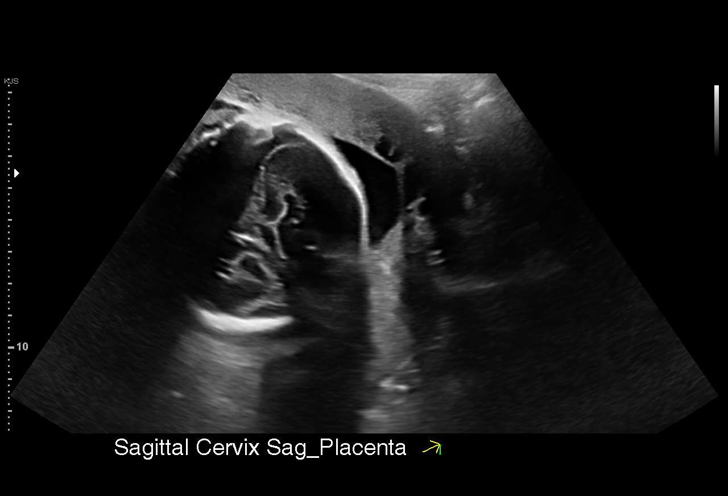
[im 101/136]
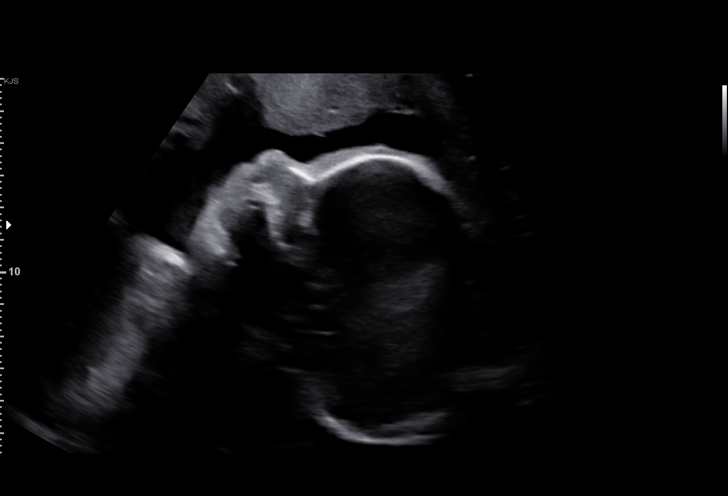
[im 111/136]
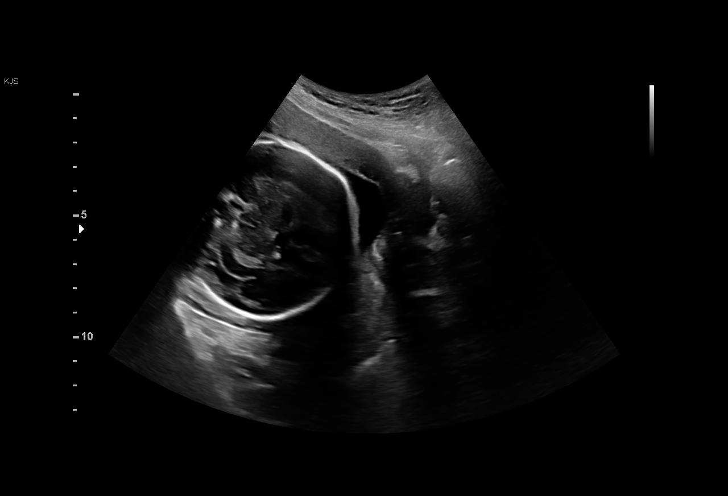
[im 121/136]
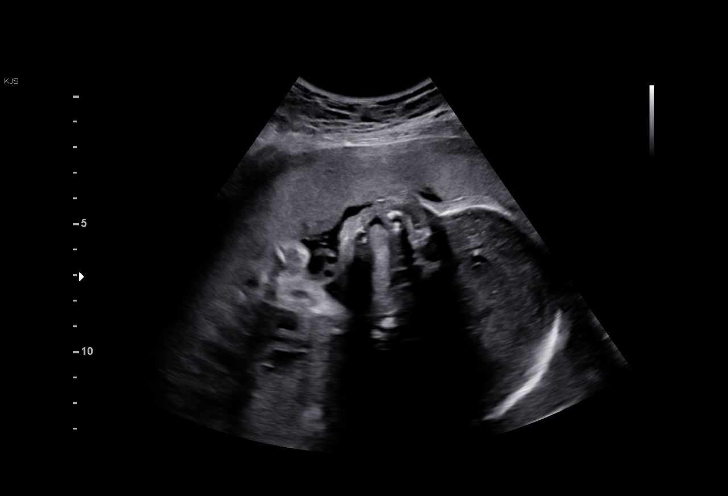
[im 131/136]
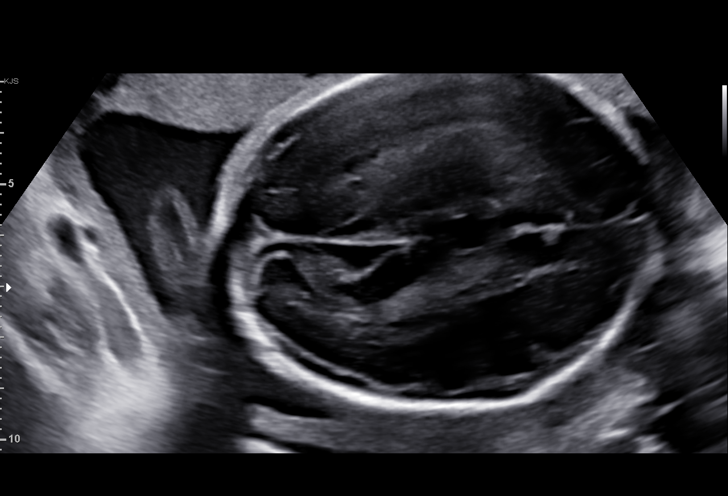

[13 of 28 positions shown; findings below may reference images not displayed]

Indications

 High risk pregnancy due to maternal drug
 abuse (Opiates & Marijuana)
 Encounter for antenatal screening for
 malformations
 26 weeks gestation of pregnancy
Fetal Evaluation

 Num Of Fetuses:         1
 Fetal Heart Rate(bpm):  138
 Cardiac Activity:       Observed
 Presentation:           Cephalic
 Placenta:               Anterior
 P. Cord Insertion:      Visualized, central

 Amniotic Fluid
 AFI FV:      Within normal limits

                             Largest Pocket(cm)

Biometry

 BPD:      69.9  mm     G. Age:  28w 1d         79  %    CI:        73.67   %    70 - 86
                                                         FL/HC:      17.8   %    18.6 -
 HC:      258.7  mm     G. Age:  28w 1d         65  %    HC/AC:      1.09        1.05 -
 AC:      237.3  mm     G. Age:  28w 0d         77  %    FL/BPD:     66.0   %    71 - 87
 FL:       46.1  mm     G. Age:  25w 2d        4.9  %    FL/AC:      19.4   %    20 - 24
 HUM:        45  mm     G. Age:  26w 5d         42  %
 CER:      32.5  mm     G. Age:  27w 6d         88  %

 LV:          4  mm
 Est. FW:    9142  gm      2 lb 4 oz     49  %
OB History

 Gravidity:    3
 Living:       1
Gestational Age

 LMP:           29w 0d        Date:  06/26/21                 EDD:   04/02/22
 U/S Today:     27w 3d                                        EDD:   04/13/22
 Best:          26w 6d     Det. By:  Early Ultrasound         EDD:   04/17/22
                                     (08/21/21)
Anatomy

 Cranium:               Appears normal         LVOT:                   Appears normal
 Cavum:                 Appears normal         Aortic Arch:            Appears normal
 Ventricles:            Appears normal         Ductal Arch:            Appears normal
 Choroid Plexus:        Appears normal         Diaphragm:              Appears normal
 Cerebellum:            Appears normal         Stomach:                Appears normal, left
                                                                       sided
 Posterior Fossa:       Appears normal         Abdomen:                Appears normal
 Nuchal Fold:           Not applicable (>20    Cord Vessels:           Appears normal (3
                        wks GA)                                        vessel cord)
 Face:                  Appears normal         Kidneys:                Appear normal
                        (orbits and profile)
 Lips:                  Appears normal         Bladder:                Appears normal
 Palate:                Appears normal         Spine:                  Appears normal
 Thoracic:              Appears normal         Upper Extremities:      Appears normal
 Heart:                 Appears normal         Lower Extremities:      Appears normal
                        (4CH, axis, and
                        situs)
 RVOT:                  Appears normal

 Other:  Fetus appears to be female. Nasal bone, lenses, maxilla, mandible
         and falx, Heels/feet and open hands/5th digits, VC, 3VV and 3VTV
         visualized.
Cervix Uterus Adnexa

 Cervix
 Length:           3.05  cm.
 Normal appearance by transabdominal scan.

 Uterus
 No abnormality visualized.

 Right Ovary
 Within normal limits.

 Left Ovary
 No adnexal mass visualized.
 Cul De Sac
 No free fluid seen.

 Adnexa
 No adnexal mass visualized.
Comments

 This patient was seen for a detailed fetal anatomy scan due
 to maternal treatment with Suboxone.  The patient recently
 transferred her care from [facility] for
 delivery at [HOSPITAL].  She reports that a choroid plexus
 cyst and placenta previa were noted during her prior
 ultrasounds.
 She denies any other past medical history and denies any
 problems in her current pregnancy.
 She had a cell free DNA test earlier in her pregnancy which
 indicated a low risk for trisomy 21, 18, and 13. A female fetus
 is predicted.
 Based on the fetal biometry measurements obtained today,
 her EDC was changed to April 17, 2022, making her 26
 weeks and 6 days pregnant. The EDC April 17, 2022 is
 consistent with her prior ultrasound exams.
 There were no obvious fetal anomalies noted on today's
 ultrasound exam.  However, today's exam was limited due to
 her advanced gestational age.
 The previously noted CP cyst and placenta previa have
 resolved.
 The patient was informed that anomalies may be missed due
 to technical limitations. If the fetus is in a suboptimal position
 or maternal habitus is increased, visualization of the fetus in
 the maternal uterus may be impaired.
 Due to maternal treatment with Suboxone, we will continue to
 follow her with growth ultrasounds.
 A follow-up exam was scheduled in 4 weeks.

## 2022-01-20 ENCOUNTER — Ambulatory Visit (INDEPENDENT_AMBULATORY_CARE_PROVIDER_SITE_OTHER): Payer: BC Managed Care – PPO | Admitting: Family Medicine

## 2022-01-20 ENCOUNTER — Other Ambulatory Visit: Payer: Self-pay

## 2022-01-20 ENCOUNTER — Encounter: Payer: Self-pay | Admitting: Family Medicine

## 2022-01-20 ENCOUNTER — Other Ambulatory Visit (HOSPITAL_COMMUNITY)
Admission: RE | Admit: 2022-01-20 | Discharge: 2022-01-20 | Disposition: A | Discharge status: Home / Self Care | Payer: BC Managed Care – PPO | Source: Ambulatory Visit | Attending: Family Medicine | Admitting: Family Medicine

## 2022-01-20 VITALS — BP 116/71 | HR 86 | Wt 152.0 lb

## 2022-01-20 DIAGNOSIS — F1111 Opioid abuse, in remission: Secondary | ICD-10-CM

## 2022-01-20 DIAGNOSIS — N760 Acute vaginitis: Secondary | ICD-10-CM | POA: Diagnosis not present

## 2022-01-20 DIAGNOSIS — F419 Anxiety disorder, unspecified: Secondary | ICD-10-CM

## 2022-01-20 DIAGNOSIS — N898 Other specified noninflammatory disorders of vagina: Secondary | ICD-10-CM | POA: Insufficient documentation

## 2022-01-20 DIAGNOSIS — O4402 Placenta previa specified as without hemorrhage, second trimester: Secondary | ICD-10-CM

## 2022-01-20 DIAGNOSIS — A6000 Herpesviral infection of urogenital system, unspecified: Secondary | ICD-10-CM

## 2022-01-20 DIAGNOSIS — O26893 Other specified pregnancy related conditions, third trimester: Secondary | ICD-10-CM | POA: Insufficient documentation

## 2022-01-20 DIAGNOSIS — G40909 Epilepsy, unspecified, not intractable, without status epilepticus: Secondary | ICD-10-CM

## 2022-01-20 DIAGNOSIS — O4442 Low lying placenta NOS or without hemorrhage, second trimester: Secondary | ICD-10-CM

## 2022-01-20 DIAGNOSIS — O0990 Supervision of high risk pregnancy, unspecified, unspecified trimester: Secondary | ICD-10-CM | POA: Insufficient documentation

## 2022-01-20 DIAGNOSIS — F909 Attention-deficit hyperactivity disorder, unspecified type: Secondary | ICD-10-CM

## 2022-01-20 MED ORDER — SUBOXONE 4-1 MG SL FILM: 0.5000 | ORAL_FILM | Freq: Every day | SUBLINGUAL | 0 refills | Status: DC

## 2022-01-20 NOTE — Patient Instructions (Signed)
Third Trimester of Pregnancy The third trimester of pregnancy is from week 28 through week 32. This is months 7 through 9. The third trimester is a time when the unborn baby (fetus) is growing rapidly. At the end of the ninth month, the fetus is about 20 inches long and weighs 6-10 pounds. Body changes during your third trimester During the third trimester, your body will continue to go through many changes. The changes vary and generally return to normal after your baby is born. Physical changes Your weight will continue to increase. You can expect to gain 25-35 pounds (11-16 kg) by the end of the pregnancy if you begin pregnancy at a normal weight. If you are underweight, you can expect to gain 28-40 lb (about 13-18 kg), and if you are overweight, you can expect to gain 15-25 lb (about 7-11 kg). You may begin to get stretch marks on your hips, abdomen, and breasts. Your breasts will continue to grow and may hurt. A yellow fluid (colostrum) may leak from your breasts. This is the first milk you are producing for your baby. You may have changes in your hair. These can include thickening of your hair, rapid growth, and changes in texture. Some people also have hair loss during or after pregnancy, or hair that feels dry or thin. Your belly button may stick out. You may notice more swelling in your hands, face, or ankles. Health changes You may have heartburn. You may have constipation. You may develop hemorrhoids. You may develop swollen, bulging veins (varicose veins) in your legs. You may have increased body aches in the pelvis, back, or thighs. This is due to weight gain and increased hormones that are relaxing your joints. You may have increased tingling or numbness in your hands, arms, and legs. The skin on your abdomen may also feel numb. You may feel short of breath because of your expanding uterus. Other changes You may urinate more often because the fetus is moving lower into your pelvis  and pressing on your bladder. You may have more problems sleeping. This may be caused by the size of your abdomen, an increased need to urinate, and an increase in your body's metabolism. You may notice the fetus "dropping," or moving lower in your abdomen (lightening). You may have increased vaginal discharge. You may notice that you have pain around your pelvic bone as your uterus distends. Follow these instructions at home: Medicines Follow your health care provider's instructions regarding medicine use. Specific medicines may be either safe or unsafe to take during pregnancy. Do not take any medicines unless approved by your health care provider. Take a prenatal vitamin that contains at least 600 micrograms (mcg) of folic acid. Eating and drinking Eat a healthy diet that includes fresh fruits and vegetables, whole grains, good sources of protein such as meat, eggs, or tofu, and low-fat dairy products. Avoid raw meat and unpasteurized juice, milk, and cheese. These carry germs that can harm you and your baby. Eat 4 or 5 small meals rather than 3 large meals a day. You may need to take these actions to prevent or treat constipation: Drink enough fluid to keep your urine pale yellow. Eat foods that are high in fiber, such as beans, whole grains, and fresh fruits and vegetables. Limit foods that are high in fat and processed sugars, such as fried or sweet foods. Activity Exercise only as directed by your health care provider. Most people can continue their usual exercise routine during pregnancy. Try to  exercise for 30 minutes at least 5 days a week. Stop exercising if you experience contractions in the uterus. °Stop exercising if you develop pain or cramping in the lower abdomen or lower back. °Avoid heavy lifting. °Do not exercise if it is very hot or humid or if you are at a high altitude. °If you choose to, you may continue to have sex unless your health care provider tells you not  to. °Relieving pain and discomfort °Take frequent breaks and rest with your legs raised (elevated) if you have leg cramps or low back pain. °Take warm sitz baths to soothe any pain or discomfort caused by hemorrhoids. Use hemorrhoid cream if your health care provider approves. °Wear a supportive bra to prevent discomfort from breast tenderness. °If you develop varicose veins: °Wear support hose as told by your health care provider. °Elevate your feet for 15 minutes, 3-4 times a day. °Limit salt in your diet. °Safety °Talk to your health care provider before traveling far distances. °Do not use hot tubs, steam rooms, or saunas. °Wear your seat belt at all times when driving or riding in a car. °Talk with your health care provider if someone is verbally or physically abusive to you. °Preparing for birth °To prepare for the arrival of your baby: °Take prenatal classes to understand, practice, and ask questions about labor and delivery. °Visit the hospital and tour the maternity area. °Purchase a rear-facing car seat and make sure you know how to install it in your car. °Prepare the baby's room or sleeping area. Make sure to remove all pillows and stuffed animals from the baby's crib to prevent suffocation. °General instructions °Avoid cat litter boxes and soil used by cats. These carry germs that can cause birth defects in the baby. If you have a cat, ask someone to clean the litter box for you. °Do not douche or use tampons. Do not use scented sanitary pads. °Do not use any products that contain nicotine or tobacco, such as cigarettes, e-cigarettes, and chewing tobacco. If you need help quitting, ask your health care provider. °Do not use any herbal remedies, illegal drugs, or medicines that were not prescribed to you. Chemicals in these products can harm your baby. °Do not drink alcohol. °You will have more frequent prenatal exams during the third trimester. During a routine prenatal visit, your health care provider  will do a physical exam, perform tests, and discuss your overall health. Keep all follow-up visits. This is important. °Where to find more information °American Pregnancy Association: americanpregnancy.org °American College of Obstetricians and Gynecologists: acog.org/en/Womens%20Health/Pregnancy °Office on Women's Health: womenshealth.gov/pregnancy °Contact a health care provider if you have: °A fever. °Mild pelvic cramps, pelvic pressure, or nagging pain in your abdominal area or lower back. °Vomiting or diarrhea. °Bad-smelling vaginal discharge or foul-smelling urine. °Pain when you urinate. °A headache that does not go away when you take medicine. °Visual changes or see spots in front of your eyes. °Get help right away if: °Your water breaks. °You have regular contractions less than 5 minutes apart. °You have spotting or bleeding from your vagina. °You have severe abdominal pain. °You have difficulty breathing. °You have chest pain. °You have fainting spells. °You have not felt your baby move for the time period told by your health care provider. °You have new or increased pain, swelling, or redness in an arm or leg. °Summary °The third trimester of pregnancy is from week 28 through week 40 (months 7 through 9). °You may have more problems sleeping.   This can be caused by the size of your abdomen, an increased need to urinate, and an increase in your body's metabolism. You will have more frequent prenatal exams during the third trimester. Keep all follow-up visits. This is important. This information is not intended to replace advice given to you by your health care provider. Make sure you discuss any questions you have with your health care provider. Document Revised: 04/24/2020 Document Reviewed: 02/29/2020 Elsevier Patient Education  2022 [employer] Inc.  Contraception Choices Contraception, also called birth control, refers to methods or devices that prevent pregnancy. Hormonal methods Contraceptive  implant A contraceptive implant is a thin, plastic tube that contains a hormone that prevents pregnancy. It is different from an intrauterine device (IUD). It is inserted into the upper part of the arm by a health care provider. Implants can be effective for up to 3 years. Progestin-only injections Progestin-only injections are injections of progestin, a synthetic form of the hormone progesterone. They are given every 3 months by a health care provider. Birth control pills Birth control pills are pills that contain hormones that prevent pregnancy. They must be taken once a day, preferably at the same time each day. A prescription is needed to use this method of contraception. Birth control patch The birth control patch contains hormones that prevent pregnancy. It is placed on the skin and must be changed once a week for three weeks and removed on the fourth week. A prescription is needed to use this method of contraception. Vaginal ring A vaginal ring contains hormones that prevent pregnancy. It is placed in the vagina for three weeks and removed on the fourth week. After that, the process is repeated with a new ring. A prescription is needed to use this method of contraception. Emergency contraceptive Emergency contraceptives prevent pregnancy after unprotected sex. They come in pill form and can be taken up to 5 days after sex. They work best the sooner they are taken after having sex. Most emergency contraceptives are available without a prescription. This method should not be used as your only form of birth control. Barrier methods Female condom A female condom is a thin sheath that is worn over the penis during sex. Condoms keep sperm from going inside a woman's body. They can be used with a sperm-killing substance (spermicide) to increase their effectiveness. They should be thrown away after one use. Female condom A female condom is a soft, loose-fitting sheath that is put into the vagina before  sex. The condom keeps sperm from going inside a woman's body. They should be thrown away after one use. Diaphragm A diaphragm is a soft, dome-shaped barrier. It is inserted into the vagina before sex, along with a spermicide. The diaphragm blocks sperm from entering the uterus, and the spermicide kills sperm. A diaphragm should be left in the vagina for 6-8 hours after sex and removed within 24 hours. A diaphragm is prescribed and fitted by a health care provider. A diaphragm should be replaced every 1-2 years, after giving birth, after gaining more than 15 lb (6.8 kg), and after pelvic surgery. Cervical cap A cervical cap is a round, soft latex or plastic cup that fits over the cervix. It is inserted into the vagina before sex, along with spermicide. It blocks sperm from entering the uterus. The cap should be left in place for 6-8 hours after sex and removed within 48 hours. A cervical cap must be prescribed and fitted by a health care provider. It should be replaced   every 2 years. Sponge A sponge is a soft, circular piece of polyurethane foam with spermicide in it. The sponge helps block sperm from entering the uterus, and the spermicide kills sperm. To use it, you make it wet and then insert it into the vagina. It should be inserted before sex, left in for at least 6 hours after sex, and removed and thrown away within 30 hours. Spermicides Spermicides are chemicals that kill or block sperm from entering the cervix and uterus. They can come as a cream, jelly, suppository, foam, or tablet. A spermicide should be inserted into the vagina with an applicator at least 10-15 minutes before sex to allow time for it to work. The process must be repeated every time you have sex. Spermicides do not require a prescription. Intrauterine contraception Intrauterine device (IUD) An IUD is a T-shaped device that is put in a woman's uterus. There are two types: Hormone IUD.This type contains progestin, a synthetic  form of the hormone progesterone. This type can stay in place for 3-5 years. Copper IUD.This type is wrapped in copper wire. It can stay in place for 10 years. Permanent methods of contraception Female tubal ligation In this method, a woman's fallopian tubes are sealed, tied, or blocked during surgery to prevent eggs from traveling to the uterus. Hysteroscopic sterilization In this method, a small, flexible insert is placed into each fallopian tube. The inserts cause scar tissue to form in the fallopian tubes and block them, so sperm cannot reach an egg. The procedure takes about 3 months to be effective. Another form of birth control must be used during those 3 months. Female sterilization This is a procedure to tie off the tubes that carry sperm (vasectomy). After the procedure, the man can still ejaculate fluid (semen). Another form of birth control must be used for 3 months after the procedure. Natural planning methods Natural family planning In this method, a couple does not have sex on days when the woman could become pregnant. Calendar method In this method, the woman keeps track of the length of each menstrual cycle, identifies the days when pregnancy can happen, and does not have sex on those days. Ovulation method In this method, a couple avoids sex during ovulation. Symptothermal method This method involves not having sex during ovulation. The woman typically checks for ovulation by watching changes in her temperature and in the consistency of cervical mucus. Post-ovulation method In this method, a couple waits to have sex until after ovulation. Where to find more information Centers for Disease Control and Prevention: [employer]Ex[employer].br Summary Contraception, also called birth control, refers to methods or devices that prevent pregnancy. Hormonal methods of contraception include implants, injections, pills, patches, vaginal rings, and emergency contraceptives. Barrier methods of  contraception can include female condoms, female condoms, diaphragms, cervical caps, sponges, and spermicides. There are two types of IUDs (intrauterine devices). An IUD can be put in a woman's uterus to prevent pregnancy for 3-5 years. Permanent sterilization can be done through a procedure for males and females. Natural family planning methods involve nothaving sex on days when the woman could become pregnant. This information is not intended to replace advice given to you by your health care provider. Make sure you discuss any questions you have with your health care provider. Document Revised: 04/22/2020 Document Reviewed: 04/22/2020 Elsevier Patient Education  2022 [employer].

## 2022-01-20 NOTE — Progress Notes (Signed)
Last pap 2020 WNL per pt.  Needs GTT- scheduled 3/3 at 840am

## 2022-01-20 NOTE — Progress Notes (Signed)
Subjective:   Aimee Guzman is a 36 y.o. G3P0011 at [redacted]w[redacted]d by 5 wk outside [place] (results in [place]) being seen today for her first obstetrical visit.  Her obstetrical history is significant for  resolved placenta previa with currently low lying placenta, remote hx of epilepsy not on AEDs, HSV, OUD on suboxone . Patient does intend to breast feed. Pregnancy history fully reviewed.  Patient reports no complaints.   Patient beeing seen through [facility] in [place] up to now in pregnancy Reports that she did not discuss her OUD to a great degree with other providers as she did not feel comfortable doing so and this was a major factor in her transferring her care to [facility] She reports that she had significant issues with OUD in her first pregnancy and her first child required a two week stay in NICU during which she was not allowed to hold her very often or to breastfeed This was a very traumatic experience for her and she is hoping to avoid the same scenario in this pregnancy  During this pregnancy she has stayed on Suboxone She was originally planning to get off and start on Naltrexone However she felt intense cravings and was never able to wait the full length of time necessary to start the naltrexone She has been using tiny strips of her last prescription from months ago to get through  Also takes Adderall which is prescribed by her Neurologist Neurologist also prescribes Klonopin but she reports she has not taken it for a while, though does acknowledge that last script was filled about a month ago  HISTORY: OB History  Gravida Para Term Preterm AB Living  4 0 0 0 1 1  SAB IAB Ectopic Multiple Live Births  1 0 0 0 1    # Outcome Date GA Lbr Len/2nd Weight Sex Delivery Anes PTL Lv  4 Current           3 Gravida           2 SAB           1 Gravida              Last pap smear: No results found for: DIAGPAP, HPV, [place] NILM 11/2018, needs post partum repeat  Past  Medical History:  Diagnosis Date   HSV (herpes simplex virus) infection    Opioid use disorder    Vaginal Pap smear, abnormal    Past Surgical History:  Procedure Laterality Date   EYE SURGERY     LEEP     WRIST SURGERY     Family History  Problem Relation Age of Onset   Cancer Mother    Cancer Maternal Grandmother    Heart disease Maternal Grandfather    Social History   Tobacco Use   Smoking status: Never   Smokeless tobacco: Never  Vaping Use   Vaping Use: Every day   Substances: Nicotine  Substance Use Topics   Alcohol use: Never   Drug use: Yes    Types: Marijuana   No Known Allergies Current Outpatient Medications on File Prior to Visit  Medication Sig Dispense Refill   clonazePAM (KLONOPIN) 0.5 MG tablet Take 0.5 mg by mouth 2 (two) times daily as needed for anxiety.     Prenatal Vit-Fe Fumarate-FA (PRENATAL MULTIVITAMIN) TABS tablet Take 1 tablet by mouth daily at 12 noon.     naltrexone (DEPADE) 50 MG tablet Take by mouth. (Patient not taking: Reported on  01/20/2022)     rizatriptan (MAXALT-MLT) 10 MG disintegrating tablet Take by mouth. (Patient not taking: Reported on 01/20/2022)     valACYclovir (VALTREX) 500 MG tablet Take 1 tablet by mouth daily. (Patient not taking: Reported on 01/20/2022)     No current facility-administered medications on file prior to visit.     Exam   Vitals:   01/21/22 1538  BP: 116/71  Pulse: 86  Weight: 152 lb (68.9 kg)   Fetal Heart Rate (bpm): 150  System: General: well-developed, well-nourished female in no acute distress   Skin: normal coloration and turgor, no rashes   Neurologic: oriented, normal, negative, normal mood   Extremities: normal strength, tone, and muscle mass, ROM of all joints is normal   HEENT PERRLA, extraocular movement intact and sclera clear, anicteric   Neck supple and no masses   Respiratory:  no respiratory distress      Assessment:   Pregnancy: [id number] Patient Active Problem List    Diagnosis Date Noted   Supervision of high risk pregnancy, antepartum 01/20/2022   Low-lying placenta without hemorrhage, second trimester 11/13/2021   Closed fracture of distal end of radius 12/11/2019   Iron deficiency anemia secondary to inadequate dietary iron intake 04/10/2019   Thrombocytopenia affecting pregnancy ([place]) 04/10/2019   Pregnancy-induced glucose intolerance 04/07/2019   Cocaine abuse complicating pregnancy ([place]) 12/30/2018   Opioid use disorder, mild, in sustained remission ([place]) 12/30/2018   Genital herpes simplex 12/26/2018   Epilepsy ([place]) 05/29/2010     Plan:  1. Supervision of high risk pregnancy, antepartum BP and FHR normal TDaP given today Initial labs reviewed, all normal. Does need third trimester labs but not fasting, she will return next week to complete them Continue prenatal vitamins. Genetic Screening discussed, NIPS: results reviewed. Ultrasound discussed; fetal anatomic survey: ordered. Problem list reviewed and updated. The nature of [place] with multiple MDs and other Advanced Practice Providers was explained to patient; also emphasized that residents, students are part of our team. - Tdap vaccine greater than or equal to 7yo IM - [place] MFM OB DETAIL +14 WK; Future  2. Opioid use disorder, mild, in sustained remission [facility]) Thanked patient for her honesty and introduced her to our model of care Discussed that we strive for an open and honest relationship and that patient's can't be discharged from practice unless it is a safety issue or they are repeatedly verbally abusive to staff Recommended we continue suboxone and titrate her dose according to symptoms of cravings She is somewhat hesitant and worried about this given her experiences with the last pregnancy Discussed evidence showing that mothers who are on MAT have better outcomes and detox is not recommended in pregnancy. In addition recent literature has  shown shortest amount of admission time for NAS babies who are born with Suboxone dependence as compared to methadone or uncontrolled OUD Reassured patient that we have an excellent, caring, and experienced community of providers at [facility] who will help her and the baby in the postpartum period NAS consult placed She is also understandably very worried about DCF involvement We discussed that unfortunately this is inevitable, however based on my experience with other patients she is unlikely to lose custody of her daughter She currently has custody of her first child, is in a stable relationship, and is engaging in MAT. All of this will be very helpful in demonstrating to [facility] that she is loving parent who is ready to bring her second child  home  I also recommended we do UDS at each visit to show a trail of evidence that she is compliant with therapy We also discussed that I do not use them punitively but instead from a harm reduction perspective to see which of my patients need closer monitoring She is in agreement and provided UDS specimen After visit also noted to not have HCV screening, will obtain that and Hep A/B immunity screening at her upcoming lab draw Rx sent for Suboxone until next visit, 4mg  films sent with understanding she will be taking 2 mg daily but can uptitrate if needed and call me if she is running out  3. Low lying placenta Noted in most recent outside scans [place] ordered today to follow up  4. Genital herpes simplex, unspecified site Start prophylaxis at 34 weeks  5. Nonintractable epilepsy without status epilepticus, unspecified epilepsy type ([place]) Reports she had an episode of syncope in college, heart monitor was normal, was put on lamictal briefly but has been off of it for ~12 years Not totally clear to me that she has a true seizure disorder diagnosis  6. ADHD Receives adderal from her Neurologist, amphetamines will be expected on UDS PDMP reviewed  7. Anxiety PDMP  reviewed Has longstanding klonopin rx but states she has not been taking it recently Anticipate will show up on this UDS Long visit today, will need to explore at next visit whether [facility] referral is warranted  Routine obstetric precautions reviewed. Return in 2 weeks (on 02/03/2022) for [facility], ob visit, needs MD.

## 2022-01-21 DIAGNOSIS — F1111 Opioid abuse, in remission: Secondary | ICD-10-CM | POA: Diagnosis not present

## 2022-01-21 LAB — CERVICOVAGINAL ANCILLARY ONLY
Bacterial Vaginitis (gardnerella): POSITIVE — AB
Candida Glabrata: NEGATIVE
Candida Vaginitis: NEGATIVE
Comment: NEGATIVE
Comment: NEGATIVE
Comment: NEGATIVE

## 2022-01-21 MED ORDER — METRONIDAZOLE 0.75 % VA GEL: 1.0000 | Freq: Every day | VAGINAL | 0 refills | Status: AC

## 2022-01-22 DIAGNOSIS — F419 Anxiety disorder, unspecified: Secondary | ICD-10-CM | POA: Insufficient documentation

## 2022-01-22 DIAGNOSIS — F909 Attention-deficit hyperactivity disorder, unspecified type: Secondary | ICD-10-CM | POA: Insufficient documentation

## 2022-01-23 DIAGNOSIS — F1120 Opioid dependence, uncomplicated: Secondary | ICD-10-CM | POA: Diagnosis not present

## 2022-01-27 LAB — TOXASSURE SELECT 13 (MW), URINE

## 2022-01-29 ENCOUNTER — Other Ambulatory Visit: Payer: Self-pay | Admitting: General Practice

## 2022-01-29 DIAGNOSIS — O0990 Supervision of high risk pregnancy, unspecified, unspecified trimester: Secondary | ICD-10-CM

## 2022-01-30 ENCOUNTER — Other Ambulatory Visit: Payer: BC Managed Care – PPO

## 2022-01-30 ENCOUNTER — Other Ambulatory Visit: Payer: Self-pay

## 2022-01-30 DIAGNOSIS — F1111 Opioid abuse, in remission: Secondary | ICD-10-CM | POA: Diagnosis not present

## 2022-01-30 DIAGNOSIS — O0990 Supervision of high risk pregnancy, unspecified, unspecified trimester: Secondary | ICD-10-CM

## 2022-01-30 LAB — HEPATITIS C ANTIBODY: HCV Ab: POSITIVE

## 2022-01-30 LAB — OB RESULTS CONSOLE HEPATITIS B SURFACE ANTIGEN: Hepatitis B Surface Ag: NEGATIVE

## 2022-01-31 LAB — GLUCOSE TOLERANCE, 2 HOURS W/ 1HR
Glucose, 1 hour: 131 mg/dL (ref 70–179)
Glucose, 2 hour: 97 mg/dL (ref 70–152)
Glucose, Fasting: 92 mg/dL — ABNORMAL HIGH (ref 70–91)

## 2022-01-31 LAB — HEPATITIS B SURFACE ANTIBODY, QUANTITATIVE: Hepatitis B Surf Ab Quant: 27 m[IU]/mL (ref >9.9)

## 2022-01-31 LAB — HEPATITIS C ANTIBODY: Hep C Virus Ab: REACTIVE — AB

## 2022-01-31 LAB — CBC
Hematocrit: 33.2 % — ABNORMAL LOW (ref 34.0–46.6)
Hemoglobin: 11.5 g/dL (ref 11.1–15.9)
MCH: 31.3 pg (ref 26.6–33.0)
MCHC: 34.6 g/dL (ref 31.5–35.7)
MCV: 90 fL (ref 79–97)
Platelets: 101 10*3/uL — ABNORMAL LOW (ref 150–450)
RBC: 3.68 x10E6/uL — ABNORMAL LOW (ref 3.77–5.28)
RDW: 11.7 % (ref 11.7–15.4)
WBC: 8.9 10*3/uL (ref 3.4–10.8)

## 2022-01-31 LAB — HEPATITIS A ANTIBODY, TOTAL: hep A Total Ab: NEGATIVE

## 2022-01-31 LAB — HIV ANTIBODY (ROUTINE TESTING W REFLEX): HIV Screen 4th Generation wRfx: NONREACTIVE

## 2022-01-31 LAB — RPR: RPR Ser Ql: NONREACTIVE

## 2022-02-02 ENCOUNTER — Telehealth: Payer: Self-pay | Admitting: Lactation Services

## 2022-02-02 ENCOUNTER — Other Ambulatory Visit: Payer: Self-pay | Admitting: Lactation Services

## 2022-02-02 ENCOUNTER — Encounter: Payer: Self-pay | Admitting: Family Medicine

## 2022-02-02 DIAGNOSIS — O24419 Gestational diabetes mellitus in pregnancy, unspecified control: Secondary | ICD-10-CM | POA: Insufficient documentation

## 2022-02-02 DIAGNOSIS — Z8619 Personal history of other infectious and parasitic diseases: Secondary | ICD-10-CM | POA: Insufficient documentation

## 2022-02-02 MED ORDER — ACCU-CHEK GUIDE W/DEVICE KIT: 1.0000 | PACK | Freq: Once | 0 refills | Status: AC

## 2022-02-02 MED ORDER — ACCU-CHEK GUIDE VI STRP: ORAL_STRIP | 12 refills | Status: DC

## 2022-02-02 MED ORDER — ACCU-CHEK SOFTCLIX LANCETS MISC: 12 refills | Status: DC

## 2022-02-02 NOTE — Progress Notes (Signed)
Abnormal 2hr GTT, clinical pool please send testing supplies and schedule DM educator visit ?New gestational thrombocytopenia though borderline at 51, will plan to recheck CBC at next visit ?RPR and HIV negative ?Not immune to Hep A, recommend vaccine post partum ?Hep B immunity confirmed ?Hep C Ab positive ?I called the patient to discuss this result as well as the above recommendations ?Patient reports she had hospitalization in 2016 with acute hepatitis and elevated LFTs, subsequently had negative viral loads and told she had cleared the infection. I told her we would check and RNA quant level just for confirmation but that it definitely sounds like she has cleared the infection previously ?Regarding GTT patient states she ate some ham and ginger ale around 0030 before her blood draw at 0830. Unfortunately I think this is still valid given 8 hours elapsed between eating and the test. ?Patient also very worried about THC on recent UDS, which was otherwise appropriate with suboxone on it. She reports that she had stopped using Klonopin for sleep but is currently using THC gummies. I told her that in general this is not a big deal from a social/pregnancy perspective. There is some evidence of neurodevelopmental adverse outcomes with heavy MJ users in states that have legalized so I would recommend stopping but overall much less risky to pregnancy compared to benzos/opioids/etc.  ?Patient has appt tomorrow, planning to bring her husband, will discuss the above further at that time.

## 2022-02-02 NOTE — Telephone Encounter (Signed)
Called patient to informed her Diabetes Testing supplies have been sent to her Pharmacy and need to bring to Diabetes appt scheduled for 3/14 at 1:15 pm. Reviewed she will check her blood sugars 4 times a day (fasting and 2 hour after the first both or breakfast, lunch and dinner) and keep a log to bring to all appointments. Reviewed Pharmacist can teach her how to use or she can wait until her appointment next week. Patient voiced understanding. Patient aware of OB appointment tomorrow.  ?

## 2022-02-03 ENCOUNTER — Encounter: Payer: BC Managed Care – PPO | Admitting: Family Medicine

## 2022-02-03 ENCOUNTER — Other Ambulatory Visit: Payer: Self-pay

## 2022-02-03 ENCOUNTER — Other Ambulatory Visit (HOSPITAL_COMMUNITY)
Admission: RE | Admit: 2022-02-03 | Discharge: 2022-02-03 | Disposition: A | Discharge status: Home / Self Care | Payer: BC Managed Care – PPO | Source: Ambulatory Visit | Attending: Family Medicine | Admitting: Family Medicine

## 2022-02-03 ENCOUNTER — Ambulatory Visit (INDEPENDENT_AMBULATORY_CARE_PROVIDER_SITE_OTHER): Payer: BC Managed Care – PPO | Admitting: Family Medicine

## 2022-02-03 VITALS — BP 102/67 | HR 98 | Wt 163.0 lb

## 2022-02-03 DIAGNOSIS — Z3A00 Weeks of gestation of pregnancy not specified: Secondary | ICD-10-CM | POA: Diagnosis not present

## 2022-02-03 DIAGNOSIS — O0990 Supervision of high risk pregnancy, unspecified, unspecified trimester: Secondary | ICD-10-CM | POA: Diagnosis not present

## 2022-02-03 DIAGNOSIS — Z0000 Encounter for general adult medical examination without abnormal findings: Secondary | ICD-10-CM | POA: Diagnosis not present

## 2022-02-03 DIAGNOSIS — O4442 Low lying placenta NOS or without hemorrhage, second trimester: Secondary | ICD-10-CM

## 2022-02-03 DIAGNOSIS — A6000 Herpesviral infection of urogenital system, unspecified: Secondary | ICD-10-CM

## 2022-02-03 DIAGNOSIS — O24410 Gestational diabetes mellitus in pregnancy, diet controlled: Secondary | ICD-10-CM

## 2022-02-03 DIAGNOSIS — Z23 Encounter for immunization: Secondary | ICD-10-CM | POA: Diagnosis not present

## 2022-02-03 DIAGNOSIS — F419 Anxiety disorder, unspecified: Secondary | ICD-10-CM

## 2022-02-03 DIAGNOSIS — F909 Attention-deficit hyperactivity disorder, unspecified type: Secondary | ICD-10-CM

## 2022-02-03 DIAGNOSIS — Z8619 Personal history of other infectious and parasitic diseases: Secondary | ICD-10-CM

## 2022-02-03 DIAGNOSIS — F1111 Opioid abuse, in remission: Secondary | ICD-10-CM | POA: Diagnosis not present

## 2022-02-03 DIAGNOSIS — O99113 Other diseases of the blood and blood-forming organs and certain disorders involving the immune mechanism complicating pregnancy, third trimester: Secondary | ICD-10-CM

## 2022-02-03 DIAGNOSIS — D696 Thrombocytopenia, unspecified: Secondary | ICD-10-CM | POA: Diagnosis not present

## 2022-02-03 DIAGNOSIS — G40909 Epilepsy, unspecified, not intractable, without status epilepticus: Secondary | ICD-10-CM

## 2022-02-03 DIAGNOSIS — Z1159 Encounter for screening for other viral diseases: Secondary | ICD-10-CM | POA: Diagnosis not present

## 2022-02-03 MED ORDER — SUBOXONE 4-1 MG SL FILM: 0.5000 | ORAL_FILM | Freq: Every day | SUBLINGUAL | 0 refills | Status: DC

## 2022-02-03 NOTE — Progress Notes (Deleted)
TOXASSUR ? ?

## 2022-02-03 NOTE — Patient Instructions (Signed)

## 2022-02-03 NOTE — Progress Notes (Signed)
? ?  Subjective:  ?Aimee Guzman is a 36 y.o. G4P0011 at [redacted]w[redacted]d being seen today for ongoing prenatal care.  She is currently monitored for the following issues for this high-risk pregnancy and has Closed fracture of distal end of radius; Cocaine abuse complicating pregnancy ([place]); Epilepsy ([place]); Genital herpes simplex; Opioid use disorder, mild, in sustained remission ([place]); Iron deficiency anemia secondary to inadequate dietary iron intake; Low-lying placenta without hemorrhage, second trimester; Pregnancy-induced glucose intolerance; Gestational thrombocytopenia ([place]); Supervision of high risk pregnancy, antepartum; ADHD; Anxiety; Gestational diabetes mellitus (GDM); and History of hepatitis C on their problem list. ? ?Patient reports no complaints.  Contractions: Not present. Vag. Bleeding: None.  Movement: Present. Denies leaking of fluid.  ? ?The following portions of the patient's history were reviewed and updated as appropriate: allergies, current medications, past family history, past medical history, past social history, past surgical history and problem list. Problem list updated. ? ?Objective:  ? ?Vitals:  ? 02/03/22 1410  ?BP: 102/67  ?Pulse: 98  ?Weight: 163 lb (73.9 kg)  ? ? ?Fetal Status: Fetal Heart Rate (bpm): 164   Movement: Present    ? ?General:  Alert, oriented and cooperative. Patient is in no acute distress.  ?Skin: Skin is warm and dry. No rash noted.   ?Cardiovascular: Normal heart rate noted  ?Respiratory: Normal respiratory effort, no problems with respiration noted  ?Abdomen: Soft, gravid, appropriate for gestational age. Pain/Pressure: Absent     ?Pelvic: Vag. Bleeding: None     ?Cervical exam deferred        ?Extremities: Normal range of motion.  Edema: None  ?Mental Status: Normal mood and affect. Normal behavior. Normal judgment and thought content.  ? ?Urinalysis:     ? ?Assessment and Plan:  ?Pregnancy: G4P0011 at [redacted]w[redacted]d ? ?1. Supervision of high risk pregnancy, antepartum ?BP and  FHR normal ? ?2. History of hepatitis C ?See phone note ?Reports hx of cleared infection ?Check RNA today ? ?3. Diet controlled gestational diabetes mellitus (GDM) in third trimester ?Only just received supplies, has DM educator visit scheduled for next week ? ?4. Benign gestational thrombocytopenia in third trimester [facility]) ?Borderline platelets of 101 last check, recheck today ? ?5. Opioid use disorder, mild, in sustained remission ([place]) ?Partner Aimee Guzman present today ?We had a good discussion around MAT, rationale and importance, all questions answered ?Verbal consent for UDS today ?She is taking ~2 mg daily but not always, usually 1mg  BID ?Told her it would likely be smoother if she just did 1 mg BID consistently or 2 mg daily ? ?6. Low-lying placenta without hemorrhage, second trimester ?Resolved on most recent scan from 01/15/22 ? ?7. Anxiety ?PDMP reviewed ?Has longstanding klonopin rx, filled recently ?Anticipate to be present on UDS ?Declines further BH referral ? ?8. Attention deficit hyperactivity disorder (ADHD), unspecified ADHD type ?Has rx for adderall, PDMP reviewed, none seen on last UDS ? ?9. Genital herpes simplex, unspecified site ?Start PPX at 34 weeks ? ?10. Nonintractable epilepsy without status epilepticus, unspecified epilepsy type ([place]) ?Questionable diagnosis, not on meds ? ?Preterm labor symptoms and general obstetric precautions including but not limited to vaginal bleeding, contractions, leaking of fluid and fetal movement were reviewed in detail with the patient. ?Please refer to After Visit Summary for other counseling recommendations.  ?Return in 2 weeks (on 02/17/2022) for [facility], ob visit, needs MD. ? ? ?Clarnce Flock, MD ? ?

## 2022-02-04 LAB — CERVICOVAGINAL ANCILLARY ONLY
Bacterial Vaginitis (gardnerella): NEGATIVE
Candida Glabrata: NEGATIVE
Candida Vaginitis: NEGATIVE
Chlamydia: NEGATIVE
Comment: NEGATIVE
Comment: NEGATIVE
Comment: NEGATIVE
Comment: NEGATIVE
Comment: NEGATIVE
Comment: NORMAL
Neisseria Gonorrhea: NEGATIVE
Trichomonas: NEGATIVE

## 2022-02-04 LAB — CBC
Hematocrit: 32.7 % — ABNORMAL LOW (ref 34.0–46.6)
Hemoglobin: 11.2 g/dL (ref 11.1–15.9)
MCH: 30.8 pg (ref 26.6–33.0)
MCHC: 34.3 g/dL (ref 31.5–35.7)
MCV: 90 fL (ref 79–97)
Platelets: 112 10*3/uL — ABNORMAL LOW (ref 150–450)
RBC: 3.64 x10E6/uL — ABNORMAL LOW (ref 3.77–5.28)
RDW: 12 % (ref 11.7–15.4)
WBC: 9.8 10*3/uL (ref 3.4–10.8)

## 2022-02-04 LAB — HCV RNA QUANT: Hepatitis C Quantitation: NOT DETECTED IU/mL

## 2022-02-06 LAB — TOXASSURE SELECT 13 (MW), URINE

## 2022-02-10 ENCOUNTER — Other Ambulatory Visit: Payer: Self-pay

## 2022-02-10 ENCOUNTER — Ambulatory Visit (INDEPENDENT_AMBULATORY_CARE_PROVIDER_SITE_OTHER): Payer: BC Managed Care – PPO | Admitting: Registered"

## 2022-02-10 ENCOUNTER — Encounter: Payer: BC Managed Care – PPO | Attending: Family Medicine | Admitting: Registered"

## 2022-02-10 DIAGNOSIS — Z3A00 Weeks of gestation of pregnancy not specified: Secondary | ICD-10-CM | POA: Diagnosis not present

## 2022-02-10 DIAGNOSIS — O24419 Gestational diabetes mellitus in pregnancy, unspecified control: Secondary | ICD-10-CM | POA: Insufficient documentation

## 2022-02-10 DIAGNOSIS — Z713 Dietary counseling and surveillance: Secondary | ICD-10-CM | POA: Diagnosis not present

## 2022-02-10 DIAGNOSIS — O24410 Gestational diabetes mellitus in pregnancy, diet controlled: Secondary | ICD-10-CM

## 2022-02-10 NOTE — Progress Notes (Signed)
Patient was seen for Gestational Diabetes self-management on 02/10/22  ?Start time   1320 and End time 1420  ? ?Estimated due date: 04/02/22; [redacted]w[redacted]d ? ?Clinical: ?Medications: reviewed ?Medical History: maternal grandfather only family members, ?Labs: OGTT FBS 92, A1c n/a %  ? ? ? ?Dietary and Lifestyle History: ?Pt reports over the past 5 years has been drinking a lot of Coke, but cut down since pregnancy. ? ?Diet: broccoli occasionally, spinach or lettuce on sandwich. Doesn't eat salads, lettuce hurts stomach for several years same sxs as mother reprots with her Crohns disease. Potatoes daily, celery and carrot sticks, cucumbers and tomatoes weekly. Black beans, baked beans pinto beans, 3x/week, Dairy mostly in the form of cheese, milk hurts stomach, not a fan. ? ?Pt reports prior to pregnancy having one episode of passing out after having sxs consistent with hypoglycemia.  ? ?Physical Activity: ADL, chasing after todler ?Stress: not assessed ?Sleep: 10 - 3 am (wakes up starving) goes back to sleep, up at 5:30 am. Up a lot to urinate.  ? ?24 hr Recall:  ?First Meal: [place] bacon, toast, fruit OR cereal with skim milk, banana nut crunch OR nuts ?Snack: none ?Second meal: subway sandwich OR chicken nuggets OR leftovers ?Snack: cheese and crackers ?Third meal (5:30-6 pm): hamburger patties (air fryer), sweet potato fries,  ?Snack: none OR nerds OR skinny pop OR popcicle OR fruit ?Beverages: water with crystal light, 1 can regular ginger ale ? ?NUTRITION INTERVENTION  ?Nutrition education (E-1) on the following topics:  ? ?Initial Follow-up ? ?[x]  []  Definition of Gestational Diabetes ?[x]  []  Why dietary management is important in controlling blood glucose ?[x]  []  Effects each nutrient has on blood glucose levels ?[x]  []  Simple carbohydrates vs complex carbohydrates ?[x]  []  Fluid intake ?[x]  []  Creating a balanced meal plan ?[x]  []  Carbohydrate counting  ?[x]  []  When to check blood glucose levels ?[x]  []  Proper blood  glucose monitoring techniques ?[x]  []  Effect of stress and stress reduction techniques  ?[x]  []  Exercise effect on blood glucose levels, appropriate exercise during pregnancy ?[x]  []  Importance of limiting caffeine and abstaining from alcohol and smoking ?[x]  []  Medications used for blood sugar control during pregnancy ?[x]  []  Hypoglycemia and rule of 15 ?[x]  []  Postpartum self care ? ?Patient already has a meter, is testing pre breakfast and 2 hours after each meal. ?FBS: elevated ?Postprandial: mostly WNL ? ?Patient instructed to monitor glucose levels: ?FBS: 60 - ? 95 mg/dL  ?1 hour: ? mg/dL ?2 hour: ? mg/dL ? ?Patient received handouts: ?Nutrition Diabetes and Pregnancy ?Carbohydrate Counting List ? ?Patient will be seen for follow-up as needed.  ?

## 2022-02-12 ENCOUNTER — Ambulatory Visit: Payer: BC Managed Care – PPO | Attending: Obstetrics

## 2022-02-12 ENCOUNTER — Ambulatory Visit: Payer: BC Managed Care – PPO | Admitting: *Deleted

## 2022-02-12 ENCOUNTER — Ambulatory Visit: Payer: BC Managed Care – PPO

## 2022-02-12 ENCOUNTER — Other Ambulatory Visit: Payer: Self-pay

## 2022-02-12 ENCOUNTER — Ambulatory Visit: Payer: BC Managed Care – PPO | Attending: Obstetrics and Gynecology | Admitting: Obstetrics and Gynecology

## 2022-02-12 VITALS — BP 116/67 | HR 105

## 2022-02-12 DIAGNOSIS — O98413 Viral hepatitis complicating pregnancy, third trimester: Secondary | ICD-10-CM | POA: Insufficient documentation

## 2022-02-12 DIAGNOSIS — O99323 Drug use complicating pregnancy, third trimester: Secondary | ICD-10-CM | POA: Diagnosis not present

## 2022-02-12 DIAGNOSIS — O98419 Viral hepatitis complicating pregnancy, unspecified trimester: Secondary | ICD-10-CM

## 2022-02-12 DIAGNOSIS — F1990 Other psychoactive substance use, unspecified, uncomplicated: Secondary | ICD-10-CM

## 2022-02-12 DIAGNOSIS — Z363 Encounter for antenatal screening for malformations: Secondary | ICD-10-CM | POA: Insufficient documentation

## 2022-02-12 DIAGNOSIS — O99320 Drug use complicating pregnancy, unspecified trimester: Secondary | ICD-10-CM | POA: Diagnosis not present

## 2022-02-12 DIAGNOSIS — F1210 Cannabis abuse, uncomplicated: Secondary | ICD-10-CM | POA: Insufficient documentation

## 2022-02-12 DIAGNOSIS — O24410 Gestational diabetes mellitus in pregnancy, diet controlled: Secondary | ICD-10-CM | POA: Insufficient documentation

## 2022-02-12 DIAGNOSIS — D699 Hemorrhagic condition, unspecified: Secondary | ICD-10-CM

## 2022-02-12 DIAGNOSIS — D696 Thrombocytopenia, unspecified: Secondary | ICD-10-CM | POA: Diagnosis not present

## 2022-02-12 DIAGNOSIS — F1110 Opioid abuse, uncomplicated: Secondary | ICD-10-CM | POA: Insufficient documentation

## 2022-02-12 DIAGNOSIS — B182 Chronic viral hepatitis C: Secondary | ICD-10-CM | POA: Diagnosis not present

## 2022-02-12 DIAGNOSIS — O24419 Gestational diabetes mellitus in pregnancy, unspecified control: Secondary | ICD-10-CM

## 2022-02-12 DIAGNOSIS — O99353 Diseases of the nervous system complicating pregnancy, third trimester: Secondary | ICD-10-CM | POA: Insufficient documentation

## 2022-02-12 DIAGNOSIS — O99113 Other diseases of the blood and blood-forming organs and certain disorders involving the immune mechanism complicating pregnancy, third trimester: Secondary | ICD-10-CM

## 2022-02-12 DIAGNOSIS — Z3A31 31 weeks gestation of pregnancy: Secondary | ICD-10-CM | POA: Diagnosis not present

## 2022-02-12 DIAGNOSIS — G40909 Epilepsy, unspecified, not intractable, without status epilepticus: Secondary | ICD-10-CM | POA: Diagnosis not present

## 2022-02-12 DIAGNOSIS — F1120 Opioid dependence, uncomplicated: Secondary | ICD-10-CM

## 2022-02-12 DIAGNOSIS — Z3493 Encounter for supervision of normal pregnancy, unspecified, third trimester: Secondary | ICD-10-CM | POA: Insufficient documentation

## 2022-02-12 IMAGING — US US MFM OB FOLLOW-UP
1 series · 12 of 28 positions shown · non-contrast
Comparison: none

[Series 1: us mfm ob follow-up · 12 of 62 slices shown]
[im 3/62]
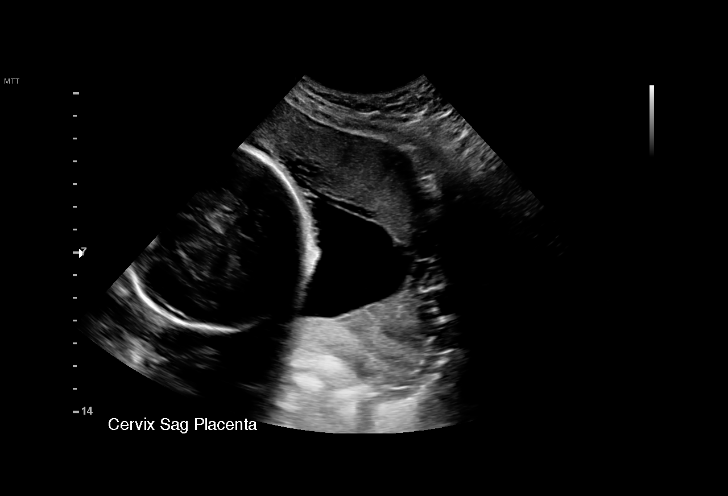
[im 7/62]
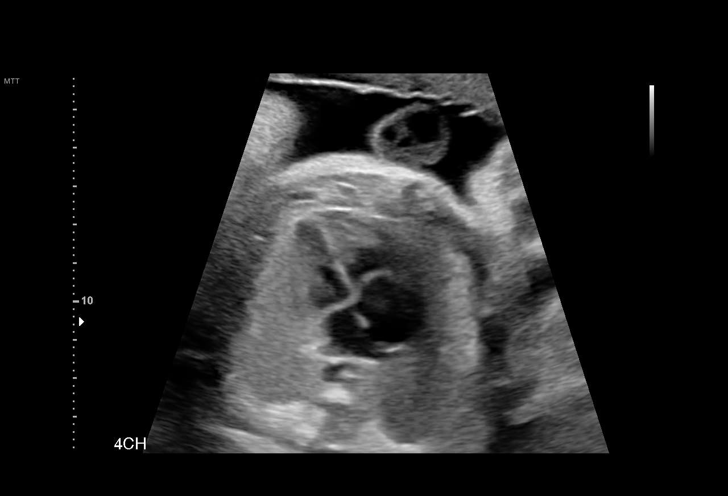
[im 12/62]
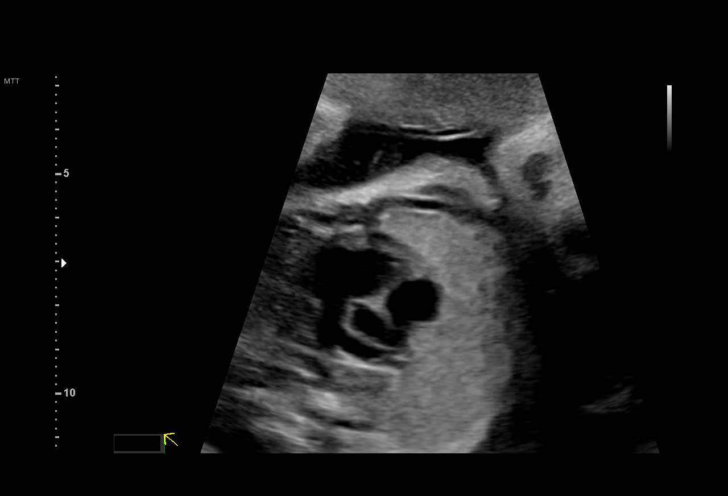
[im 19/62]
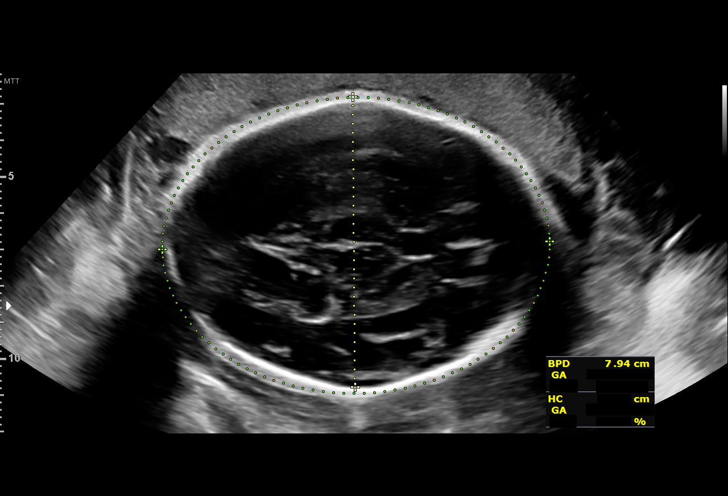
[im 23/62]
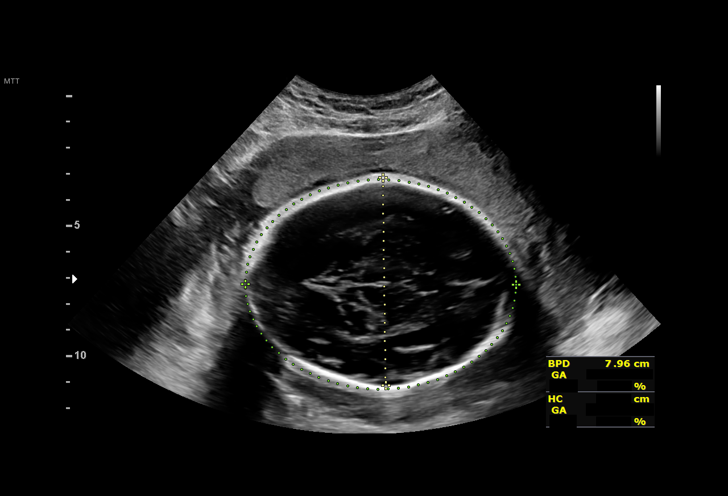
[im 28/62]
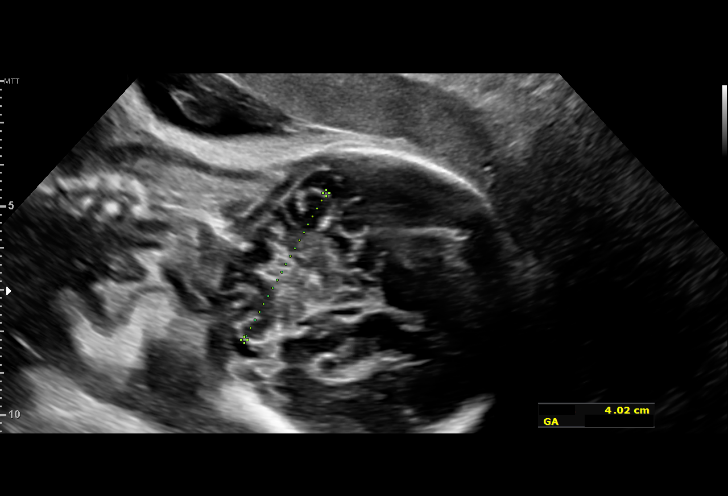
[im 34/62]
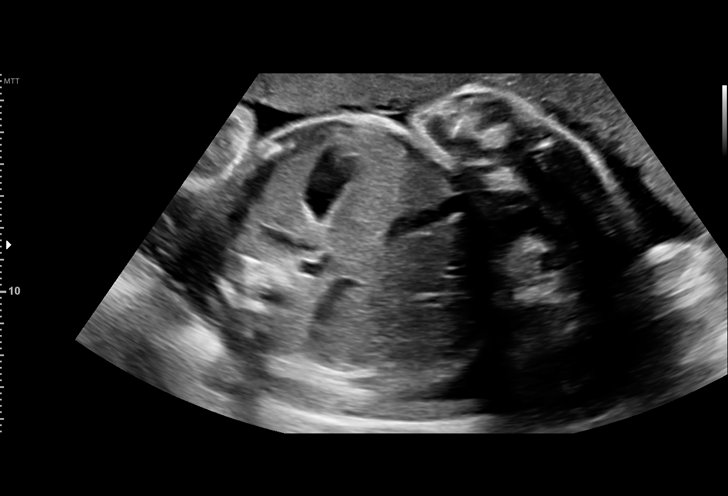
[im 39/62]
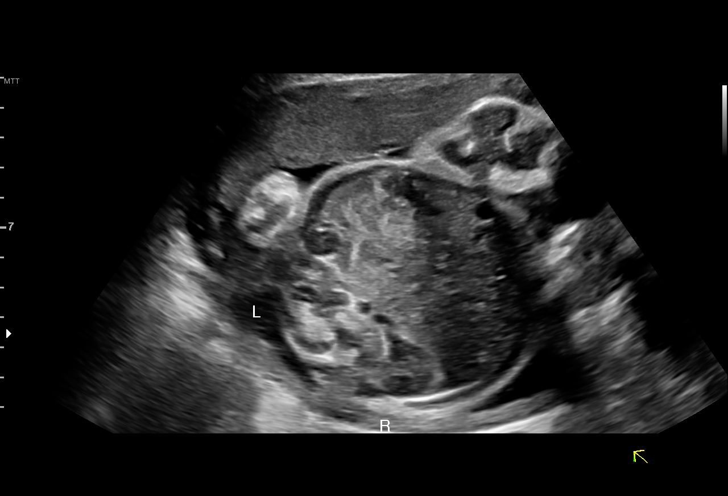
[im 43/62]
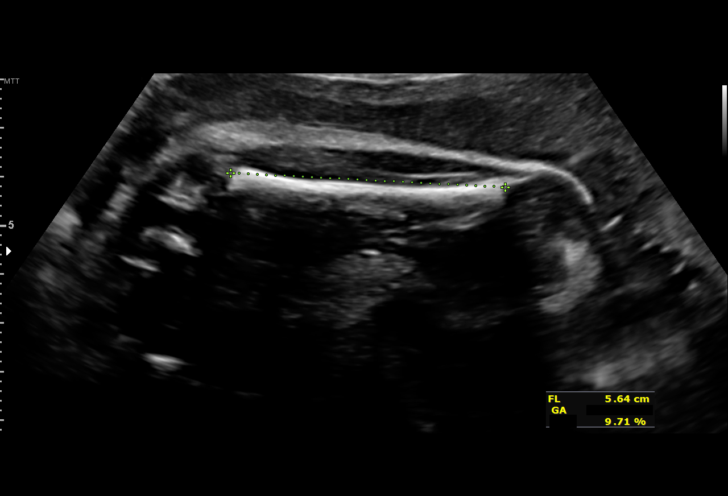
[im 50/62]
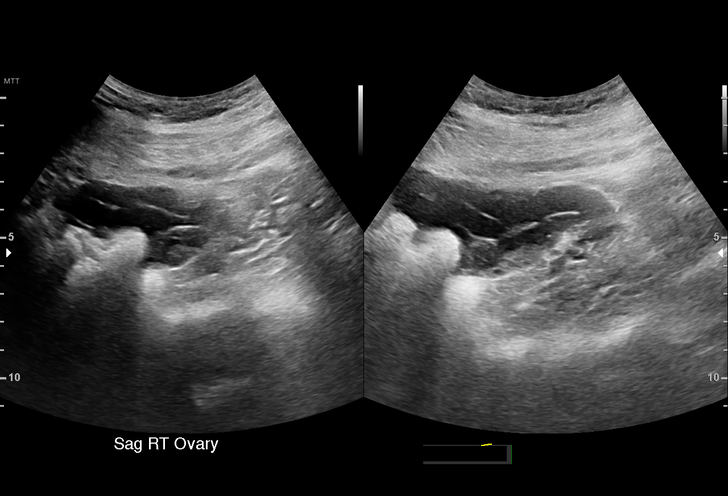
[im 55/62]
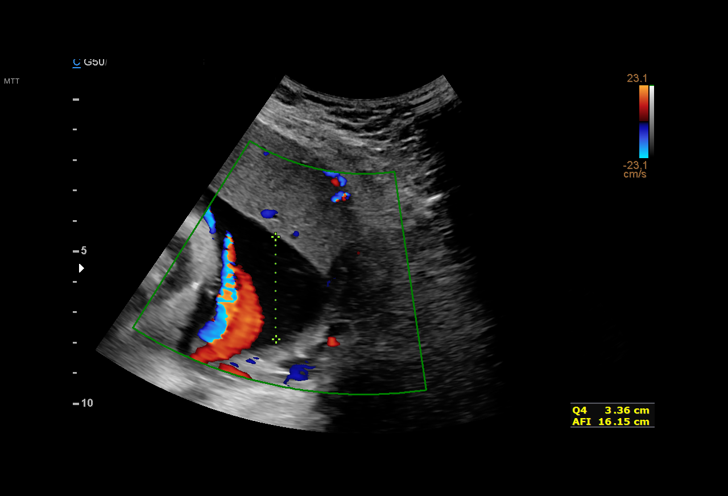
[im 59/62]
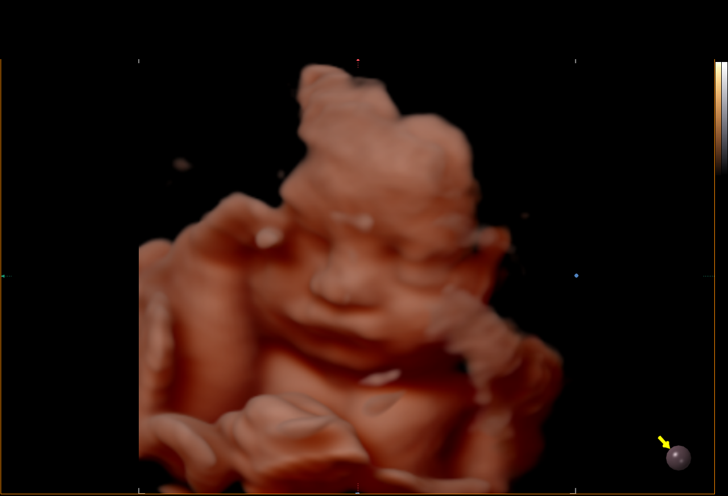

[12 of 28 positions shown; findings below may reference images not displayed]

Indications

 Gestational diabetes in pregnancy,
 unspecified control
 31 weeks gestation of pregnancy
 High risk pregnancy due to maternal drug
 abuse (Opiates & Marijuana)
 Encounter for antenatal screening for
 malformations
 Chronic Hepatitis C complicating pregnancy,
 antepartum (cleared infection RNA neg 31
 wks)
 Epilepsy complicating pregnancy, childbirth,
 or the puerperium, antepartum
 Other mental disorder complicating
 pregnancy, third trimester
 Thrombocytopenia affecting pregnancy,          O99.119,
 antepartum
Fetal Evaluation

 Num Of Fetuses:         1
 Fetal Heart Rate(bpm):  155
 Cardiac Activity:       Observed
 Presentation:           Cephalic
 Placenta:               Anterior
 P. Cord Insertion:      Previously Visualized
 Amniotic Fluid
 AFI FV:      Within normal limits

 AFI Sum(cm)     %Tile       Largest Pocket(cm)
 16.2            59

 RUQ(cm)       RLQ(cm)       LUQ(cm)        LLQ(cm)
 3
Biometry

 BPD:      79.5  mm     G. Age:  31w 6d         64  %    CI:        74.39   %    70 - 86
                                                         FL/HC:      19.2   %    19.3 -
 HC:      292.6  mm     G. Age:  32w 2d         44  %    HC/AC:      1.04        0.96 -
 AC:      280.9  mm     G. Age:  32w 1d         76  %    FL/BPD:     70.7   %    71 - 87
 FL:       56.2  mm     G. Age:  29w 4d          6  %    FL/AC:      20.0   %    20 - 24
 CER:      40.2  mm     G. Age:  32w 3d         65  %

 LV:          5  mm

 Est. FW:    3136  gm    3 lb 14 oz      44  %
OB History

 Gravidity:    3         Term:   1         SAB:   1
 TOP:          1        Living:  1
Gestational Age

 LMP:           33w 0d        Date:  06/26/21                 EDD:   04/02/22
 U/S Today:     31w 3d                                        EDD:   04/13/22
 Best:          31w 1d     Det. By:  Early Ultrasound         EDD:   04/15/22
                                     (08/19/21)
Anatomy

 Cranium:               Appears normal         LVOT:                   Appears normal
 Cavum:                 Appears normal         Aortic Arch:            Previously seen
 Ventricles:            Appears normal         Ductal Arch:            Previously seen
 Choroid Plexus:        Appears normal         Diaphragm:              Appears normal
 Cerebellum:            Previously seen        Stomach:                Appears normal, left
                                                                       sided
 Posterior Fossa:       Previously seen        Abdomen:                Appears normal
 Nuchal Fold:           Not applicable (>20    Abdominal Wall:         Not well visualized
                        wks GA)
 Face:                  Orbits and profile     Cord Vessels:           Previously seen
                        previously seen
 Lips:                  Previously seen        Kidneys:                Appear normal
 Palate:                Previously seen        Bladder:                Appears normal
 Thoracic:              Appears normal         Spine:                  Previously seen
 Heart:                 Appears normal         Upper Extremities:      Previously seen
                        (4CH, axis, and
                        situs)
 RVOT:                  Appears normal         Lower Extremities:      Previously seen

 Other:  Female gender previously seen. Nasal bone, lenses, maxilla,
         mandible and falx, Heels/feet and open hands/5th digits, VC, 3VV
         and 3VTV visualized previously.
Cervix Uterus Adnexa
 Cervix
 Length:           3.05  cm.
 Normal appearance by transabdominal scan.

 Uterus
 No abnormality visualized.

 Right Ovary
 Within normal limits.

 Left Ovary
 Within normal limits.

 Cul De Sac
 No free fluid seen.

 Adnexa
 No adnexal mass visualized.
Impression

 Fetal growth is appropriate for gestational age.  Amniotic fluid
 is normal and good fetal activity is seen.  Incidentally
 observed BPP is reassuring ([DATE]).
 [id number]
 Consultation (see [REDACTED] )

 I had the [facility] She is G2 P1 at 31-weeks' gestation
 and is here for follow-up fetal growth assessment.
 He is a new diagnosis of gestational diabetes.  Patient met
 with our diabetic educator 2 days ago and is checking her
 blood glucose regularly.  Her fasting levels range between 92
 and 119 [place]/DL and postprandial levels between 105 255
 mg/DL.
 Patient has chronic hepatitis C infection in the recent viral
 RNA was undetectable.
 She is on Suboxone maintenance and takes 2 mg daily.  She
 does not have withdrawal symptoms.  She does not have
 hypertension and her blood pressure today at her office is
 116/67 mmHg.

 Pregnancy dating
 I reviewed her first trimester ultrasound performed at 5 weeks
 and 6 days gestation at [REDACTED].  Her pregnancy was
 dated by CRL measurement, and her EDD based on that
 should be assigned at 04/15/2022.

 Gestational diabetes
 I explained the diagnosis of gestational diabetes.  I
 emphasized the importance of good blood glucose control to
 prevent adverse fetal or neonatal outcomes.  I discussed
 blood glucose normal values. I encouraged her to check her
 blood glucose regularly.
 Possible complications of gestational diabetes include fetal
 macrosomia, shoulder dystocia and birth injuries, stillbirth (in
 poorly controlled diabetes) and neonatal respiratory
 syndrome and other complications.

 In about 85% of cases, gestational diabetes is well controlled
 by diet alone.  Exercise reduces the need for insulin.  Medical
 treatment includes oral hypoglycemics or insulin. Patient may
 require medical treatment for control of diabetes. She prefers
 metformin before insulin.

 Timing of delivery: In well-controlled diabetes on diet, patient
 can be delivered at 39- or 40-weeks' gestation. Vaginal
 delivery is not contraindicated.
 Type 2 diabetes develops in up to women with GDM. I
 recommend postpartum screening with 75-g glucose load at
 6 to 12 weeks after delivery.
Recommendations

 -An appointment was made for her to return in 3 weeks for
 BPP and then weekly BPP till delivery.
 -Fetal growth assessment in 4 weeks.
                 [place], [place]

## 2022-02-12 NOTE — Progress Notes (Signed)
Maternal-Fetal Medicine  ? ?Name: Aimee Guzman ?DOB: 08/08/1996 ?MRN: [id number] ?Referring Provider: Clayton Lefort, MD ? ?I had the pleasure of seeing [name] today at the [facility]. She is G2 P1 at 31-weeks' gestation and is here for follow-up fetal growth assessment. ?He is a new diagnosis of gestational diabetes.  Patient met with our diabetic educator 2 days ago and is checking her blood glucose regularly.  Her fasting levels range between 92 and 119 MGs/DL and postprandial levels between 105 255 mg/DL. ?Patient has chronic hepatitis C infection in the recent viral RNA was undetectable. ?She is on Suboxone maintenance and takes 2 mg daily.  She does not have withdrawal symptoms.  She does not have hypertension and her blood pressure today at her office is 116/67 mmHg. ? ?Ultrasound ?Fetal growth is appropriate for gestational age.  Amniotic fluid is normal and good fetal activity is seen.  Incidentally observed BPP is reassuring (8/8). ? ?Pregnancy dating ?I reviewed her first trimester ultrasound performed at 5 weeks and 6 days gestation at [place].  Her pregnancy was dated by [facility] measurement, and her EDD based on that should be assigned at 04/15/2022. ? ?Gestational diabetes ?I explained the diagnosis of gestational diabetes.  I emphasized the importance of good blood glucose control to prevent adverse fetal or neonatal outcomes.  I discussed blood glucose normal values. I encouraged her to check her blood glucose regularly. ?Possible complications of gestational diabetes include fetal macrosomia, shoulder dystocia and birth injuries, stillbirth (in poorly controlled diabetes) and neonatal respiratory syndrome and other complications. ? ?In about 85% of cases, gestational diabetes is well controlled by diet alone.  Exercise reduces the need for insulin.  Medical treatment includes oral hypoglycemics or insulin. Patient may require medical treatment for control of diabetes. She  prefers metformin before insulin. ? ?Timing of delivery: In well-controlled diabetes on diet, patient can be delivered at 52- or 40-weeks' gestation. Vaginal delivery is not contraindicated. ?Type 2 diabetes develops in up to women with GDM. I recommend postpartum screening with 75-g glucose load at 6 to 12 weeks after delivery. ? ?Recommendations ?-An appointment was made for her to return in 3 weeks for BPP and then weekly BPP till delivery. ?-Fetal growth assessment in 4 weeks. ? ?Thank you for consultation.  If you have any questions or concerns, please contact me the [facility].  Consultation including face-to-face (more than 50%) counseling 30 minutes. ? ?

## 2022-02-13 ENCOUNTER — Encounter: Payer: Self-pay | Admitting: Family Medicine

## 2022-02-13 ENCOUNTER — Other Ambulatory Visit: Payer: Self-pay | Admitting: *Deleted

## 2022-02-13 DIAGNOSIS — F1120 Opioid dependence, uncomplicated: Secondary | ICD-10-CM

## 2022-02-13 DIAGNOSIS — O24410 Gestational diabetes mellitus in pregnancy, diet controlled: Secondary | ICD-10-CM

## 2022-02-17 ENCOUNTER — Ambulatory Visit (INDEPENDENT_AMBULATORY_CARE_PROVIDER_SITE_OTHER): Payer: BC Managed Care – PPO | Admitting: Family Medicine

## 2022-02-17 ENCOUNTER — Other Ambulatory Visit: Payer: Self-pay

## 2022-02-17 VITALS — BP 117/77 | HR 96 | Wt 166.1 lb

## 2022-02-17 DIAGNOSIS — F909 Attention-deficit hyperactivity disorder, unspecified type: Secondary | ICD-10-CM

## 2022-02-17 DIAGNOSIS — O99113 Other diseases of the blood and blood-forming organs and certain disorders involving the immune mechanism complicating pregnancy, third trimester: Secondary | ICD-10-CM

## 2022-02-17 DIAGNOSIS — F1111 Opioid abuse, in remission: Secondary | ICD-10-CM

## 2022-02-17 DIAGNOSIS — O0990 Supervision of high risk pregnancy, unspecified, unspecified trimester: Secondary | ICD-10-CM

## 2022-02-17 DIAGNOSIS — Z8619 Personal history of other infectious and parasitic diseases: Secondary | ICD-10-CM

## 2022-02-17 DIAGNOSIS — F419 Anxiety disorder, unspecified: Secondary | ICD-10-CM

## 2022-02-17 DIAGNOSIS — D508 Other iron deficiency anemias: Secondary | ICD-10-CM

## 2022-02-17 DIAGNOSIS — G40909 Epilepsy, unspecified, not intractable, without status epilepticus: Secondary | ICD-10-CM

## 2022-02-17 DIAGNOSIS — O24410 Gestational diabetes mellitus in pregnancy, diet controlled: Secondary | ICD-10-CM

## 2022-02-17 DIAGNOSIS — A6000 Herpesviral infection of urogenital system, unspecified: Secondary | ICD-10-CM

## 2022-02-17 DIAGNOSIS — D696 Thrombocytopenia, unspecified: Secondary | ICD-10-CM

## 2022-02-17 NOTE — Patient Instructions (Signed)

## 2022-02-17 NOTE — Progress Notes (Signed)
? ?  Subjective:  ?Aimee Guzman is a 36 y.o. G4P0011 at [redacted]w[redacted]d being seen today for ongoing prenatal care.  She is currently monitored for the following issues for this high-risk pregnancy and has Closed fracture of distal end of radius; Cocaine abuse complicating pregnancy ([place]); Epilepsy ([place]); Genital herpes simplex; Opioid use disorder, mild, in sustained remission ([place]); Iron deficiency anemia secondary to inadequate dietary iron intake; Pregnancy-induced glucose intolerance; Gestational thrombocytopenia ([place]); Supervision of high risk pregnancy, antepartum; ADHD; Anxiety; Gestational diabetes mellitus (GDM); and History of hepatitis C on their problem list. ? ?Patient reports no complaints.  Contractions: Not present. Vag. Bleeding: None.  Movement: Present. Denies leaking of fluid.  ? ?The following portions of the patient's history were reviewed and updated as appropriate: allergies, current medications, past family history, past medical history, past social history, past surgical history and problem list. Problem list updated. ? ?Objective:  ? ?Vitals:  ? 02/17/22 1531  ?BP: 117/77  ?Pulse: 96  ?Weight: 166 lb 1.6 oz (75.3 kg)  ? ? ?Fetal Status: Fetal Heart Rate (bpm): 130   Movement: Present    ? ?General:  Alert, oriented and cooperative. Patient is in no acute distress.  ?Skin: Skin is warm and dry. No rash noted.   ?Cardiovascular: Normal heart rate noted  ?Respiratory: Normal respiratory effort, no problems with respiration noted  ?Abdomen: Soft, gravid, appropriate for gestational age. Pain/Pressure: Absent     ?Pelvic: Vag. Bleeding: None     ?Cervical exam deferred        ?Extremities: Normal range of motion.  Edema: None  ?Mental Status: Normal mood and affect. Normal behavior. Normal judgment and thought content.  ? ?Urinalysis:     ? ? ? ?Assessment and Plan:  ?Pregnancy: G4P0011 at [redacted]w[redacted]d ? ?1. Supervision of high risk pregnancy, antepartum ?BP and FHR normal ? ?2. History of hepatitis C ?Neg  viral load, c/w cleared infection ? ?3. Diet controlled gestational diabetes mellitus (GDM) in third trimester ?See log above ?Post prandials are ~70% at goal ?On review of dietary history she reports consistent late night snacking around 0300 ?Given these are not true fastings not as concerned ?Reports she eats dinner early around 1700 and then nothing afterwards ?Recommended she have appropriate snack before bedtime to ward this off and we will reassess next visit to see if meds are needed ?Following w MFM, growth scans have been normal ? ?4. Nonintractable epilepsy without status epilepticus, unspecified epilepsy type ([place]) ?Questionable diagnosis, not on meds ? ?5. Genital herpes simplex, unspecified site ?Prophylaxis at 34-36 weeks ? ?6. Benign gestational thrombocytopenia in third trimester [facility]) ?Lab Results  ?Component Value Date  ? WBC 9.8 02/03/2022  ? HGB 11.2 02/03/2022  ? HCT 32.7 (L) 02/03/2022  ? MCV 90 02/03/2022  ? PLT 112 (L) 02/03/2022  ? ? ?7. Opioid use disorder, mild, in sustained remission ([place]) ?Taking 2 mg daily ?UDS today ?Already has refill at pharmacy ? ?8. Anxiety ?Took klonopin this weekend for anxiety, has prescription and anxiety diagnosis, reassured her this was OK ? ?9. Attention deficit hyperactivity disorder (ADHD), unspecified ADHD type ?Has diagnosis, regular prscriber ? ? ?Preterm labor symptoms and general obstetric precautions including but not limited to vaginal bleeding, contractions, leaking of fluid and fetal movement were reviewed in detail with the patient. ?Please refer to After Visit Summary for other counseling recommendations.  ?Return in 2 weeks (on 03/03/2022). ? ? ?Clarnce Flock, MD ? ?

## 2022-02-20 DIAGNOSIS — F1120 Opioid dependence, uncomplicated: Secondary | ICD-10-CM | POA: Diagnosis not present

## 2022-02-24 LAB — TOXASSURE SELECT 13 (MW), URINE

## 2022-03-03 ENCOUNTER — Ambulatory Visit: Payer: BC Managed Care – PPO | Admitting: *Deleted

## 2022-03-03 ENCOUNTER — Encounter: Payer: Self-pay | Admitting: *Deleted

## 2022-03-03 ENCOUNTER — Ambulatory Visit (INDEPENDENT_AMBULATORY_CARE_PROVIDER_SITE_OTHER): Payer: BC Managed Care – PPO | Admitting: Obstetrics and Gynecology

## 2022-03-03 ENCOUNTER — Encounter: Payer: Self-pay | Admitting: Obstetrics and Gynecology

## 2022-03-03 ENCOUNTER — Ambulatory Visit: Payer: BC Managed Care – PPO | Attending: Obstetrics and Gynecology

## 2022-03-03 VITALS — BP 122/71 | HR 89 | Wt 168.0 lb

## 2022-03-03 VITALS — BP 113/71 | HR 94

## 2022-03-03 DIAGNOSIS — D696 Thrombocytopenia, unspecified: Secondary | ICD-10-CM

## 2022-03-03 DIAGNOSIS — Z3A33 33 weeks gestation of pregnancy: Secondary | ICD-10-CM

## 2022-03-03 DIAGNOSIS — F1120 Opioid dependence, uncomplicated: Secondary | ICD-10-CM | POA: Insufficient documentation

## 2022-03-03 DIAGNOSIS — O98413 Viral hepatitis complicating pregnancy, third trimester: Secondary | ICD-10-CM | POA: Diagnosis not present

## 2022-03-03 DIAGNOSIS — O24410 Gestational diabetes mellitus in pregnancy, diet controlled: Secondary | ICD-10-CM | POA: Diagnosis not present

## 2022-03-03 DIAGNOSIS — F1990 Other psychoactive substance use, unspecified, uncomplicated: Secondary | ICD-10-CM

## 2022-03-03 DIAGNOSIS — G40909 Epilepsy, unspecified, not intractable, without status epilepticus: Secondary | ICD-10-CM

## 2022-03-03 DIAGNOSIS — O99113 Other diseases of the blood and blood-forming organs and certain disorders involving the immune mechanism complicating pregnancy, third trimester: Secondary | ICD-10-CM

## 2022-03-03 DIAGNOSIS — A6000 Herpesviral infection of urogenital system, unspecified: Secondary | ICD-10-CM

## 2022-03-03 DIAGNOSIS — O99119 Other diseases of the blood and blood-forming organs and certain disorders involving the immune mechanism complicating pregnancy, unspecified trimester: Secondary | ICD-10-CM

## 2022-03-03 DIAGNOSIS — O0990 Supervision of high risk pregnancy, unspecified, unspecified trimester: Secondary | ICD-10-CM

## 2022-03-03 DIAGNOSIS — O99323 Drug use complicating pregnancy, third trimester: Secondary | ICD-10-CM

## 2022-03-03 DIAGNOSIS — O99320 Drug use complicating pregnancy, unspecified trimester: Secondary | ICD-10-CM

## 2022-03-03 DIAGNOSIS — O99353 Diseases of the nervous system complicating pregnancy, third trimester: Secondary | ICD-10-CM

## 2022-03-03 DIAGNOSIS — B182 Chronic viral hepatitis C: Secondary | ICD-10-CM

## 2022-03-03 DIAGNOSIS — F1410 Cocaine abuse, uncomplicated: Secondary | ICD-10-CM

## 2022-03-03 DIAGNOSIS — Z8619 Personal history of other infectious and parasitic diseases: Secondary | ICD-10-CM

## 2022-03-03 DIAGNOSIS — F1111 Opioid abuse, in remission: Secondary | ICD-10-CM

## 2022-03-03 IMAGING — US US MFM FETAL BPP W/O NON-STRESS
1 series · 15 of 28 positions shown · non-contrast
Comparison: none

[Series 1: us mfm fetal bpp w/o non-stress · 33 acquisitions, 15 frames shown]
[im 1/33]
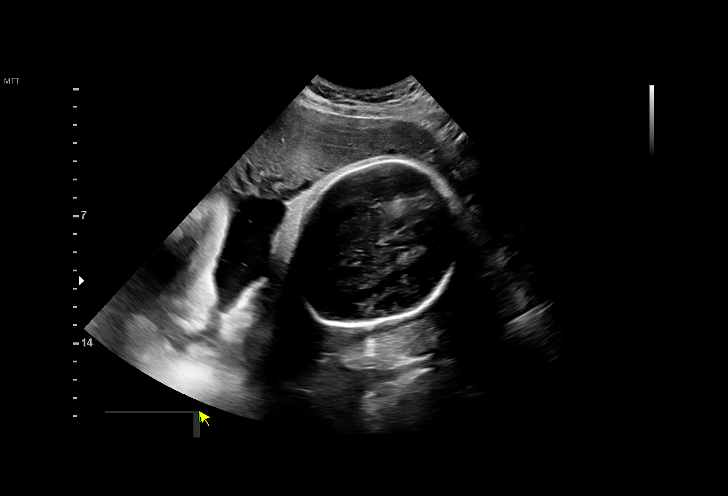
[im 3/33]
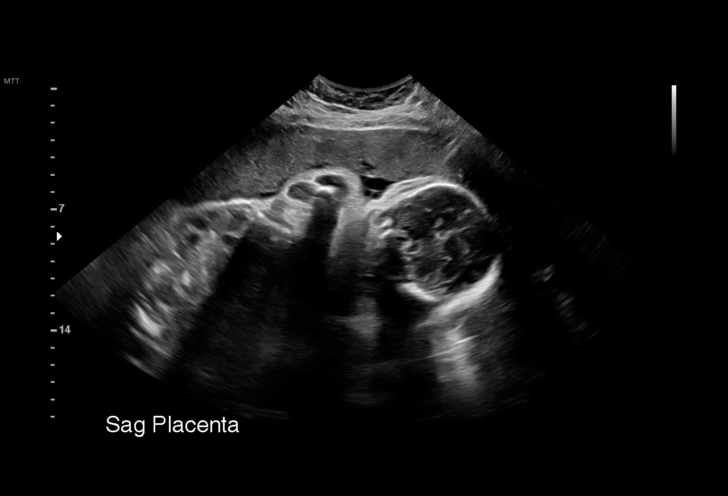
[im 5/33]
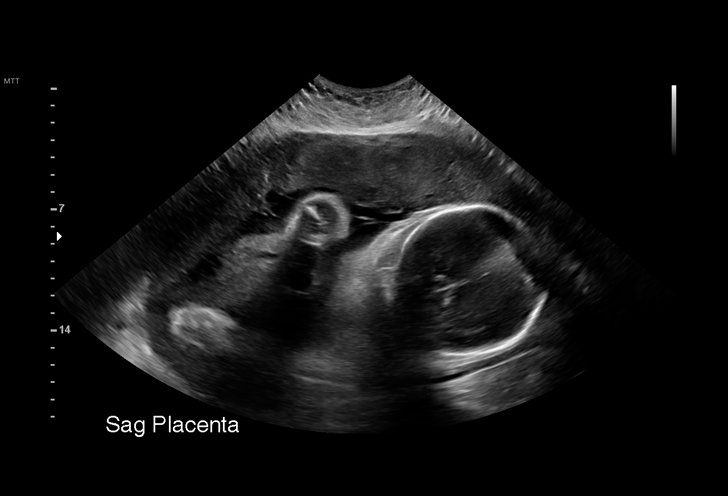
[im 8/33]
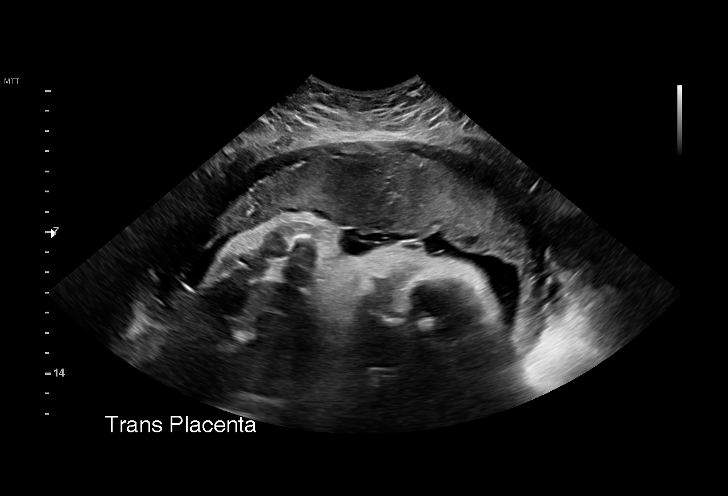
[im 10/33]
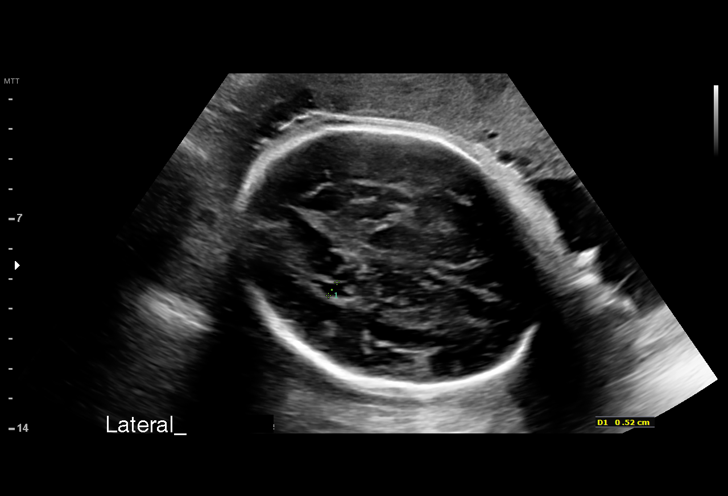
[im 12/33]
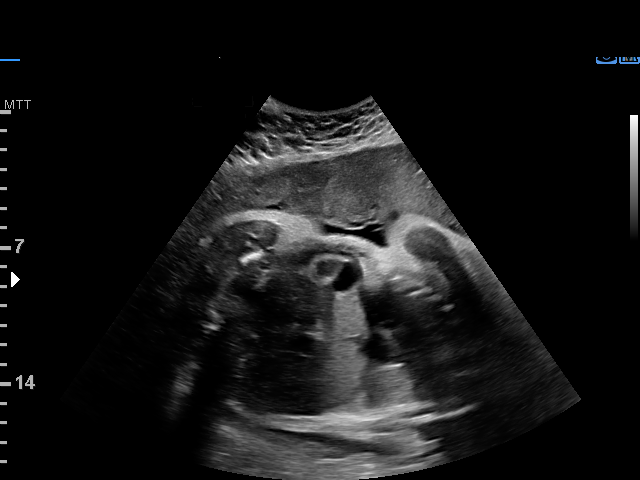
[im 15/33]
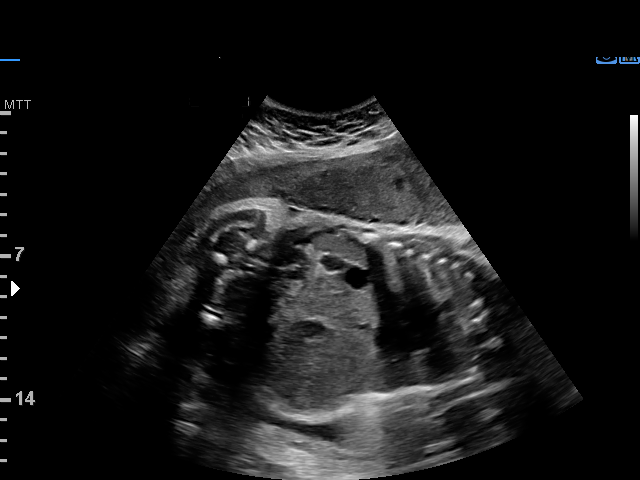
[im 17/33]
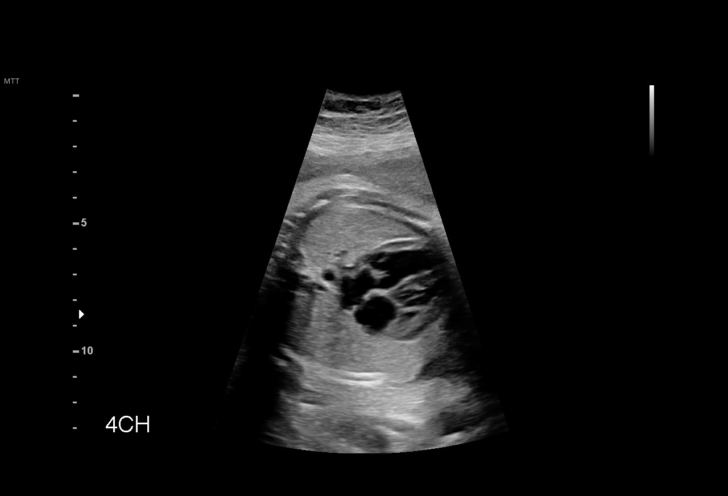
[im 18/33]
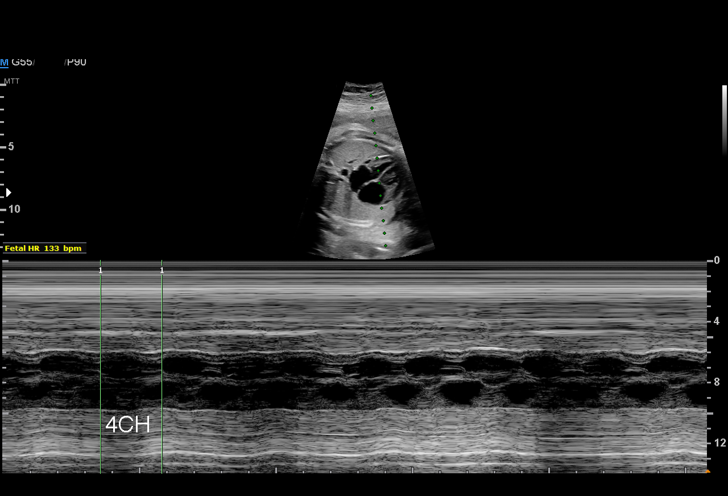
[im 21/33]
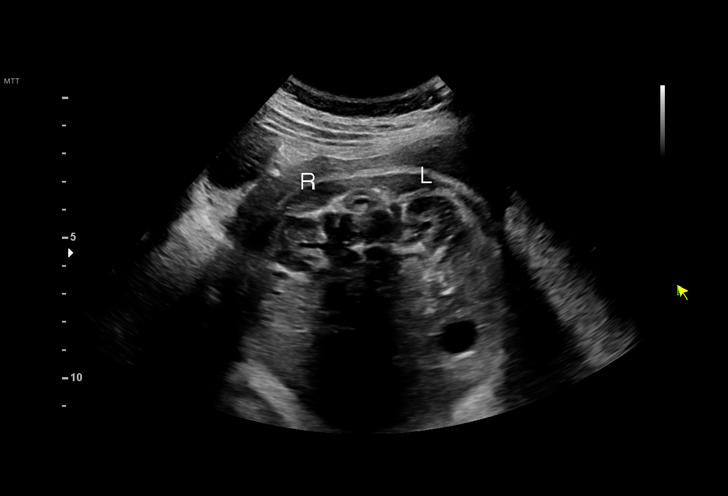
[im 23/33]
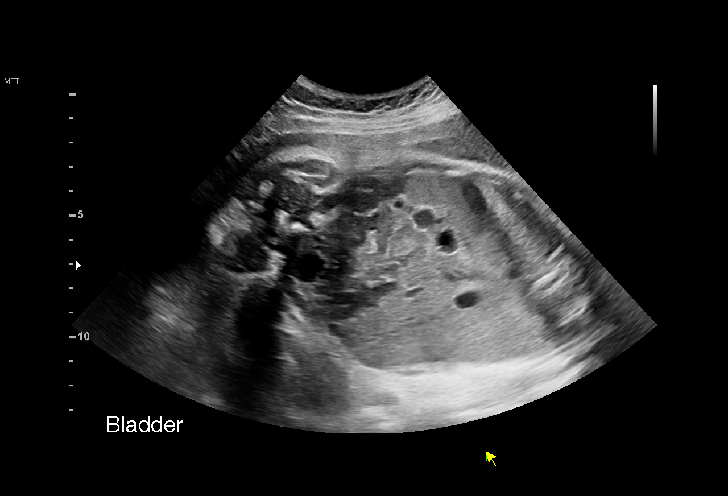
[im 25/33]
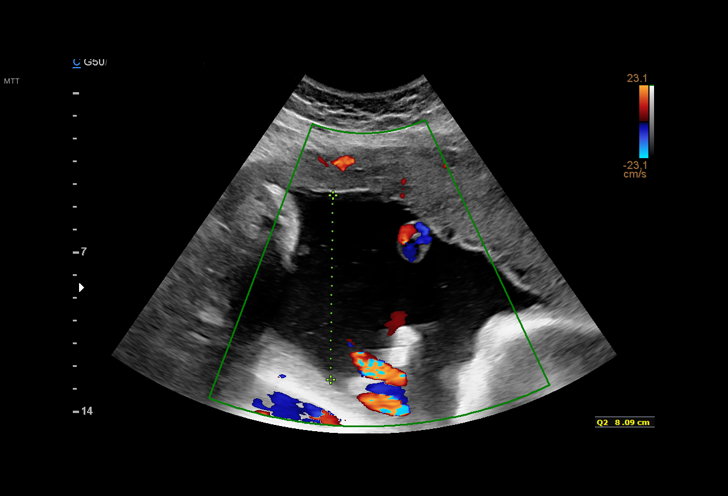
[im 28/33]
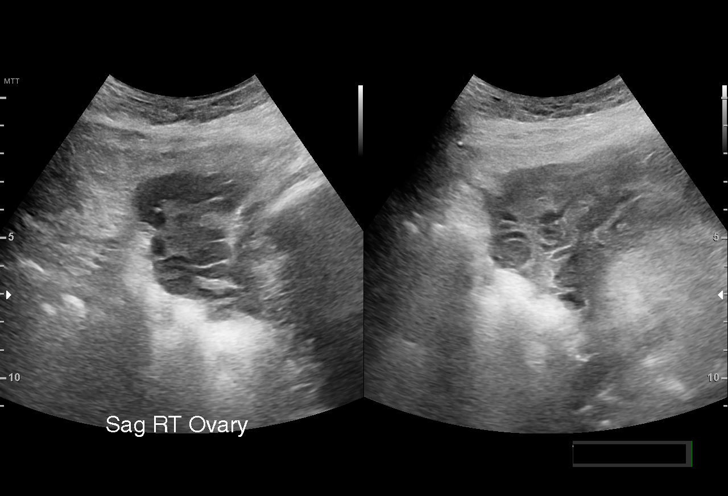
[im 30/33]
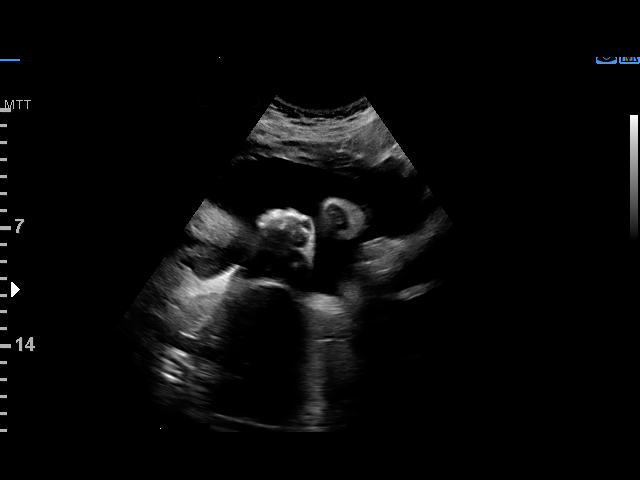
[im 33/33]
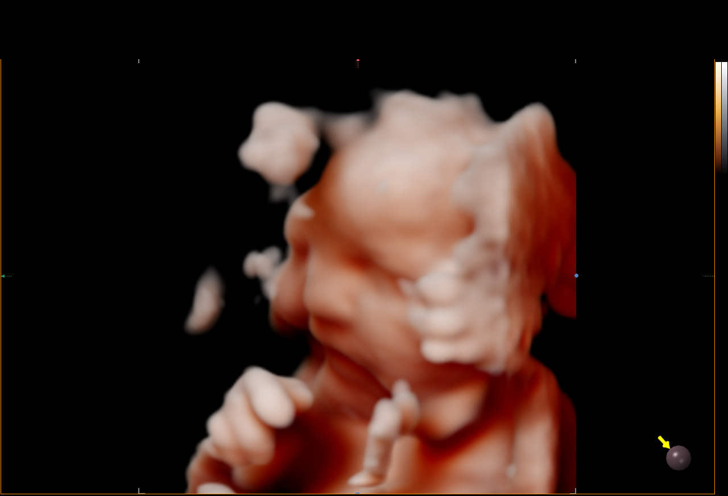

[15 of 28 positions shown; findings below may reference images not displayed]

Indications

 Gestational diabetes in pregnancy, diet
 controlled
 Suboxone use
 Chronic Hepatitis C complicating pregnancy,
 antepartum (cleared infection RNA neg 31
 wks)
 33 weeks gestation of pregnancy
 High risk pregnancy due to maternal drug
 abuse (Opiates & Marijuana)
 Epilepsy complicating pregnancy, childbirth,
 or the puerperium, antepartum
 Other mental disorder complicating
 pregnancy, third trimester
 Thrombocytopenia affecting pregnancy,          O99.119,
 antepartum
Fetal Evaluation

 Num Of Fetuses:         1
 Fetal Heart Rate(bpm):  133
 Cardiac Activity:       Observed
 Presentation:           Cephalic
 Placenta:               Anterior
 P. Cord Insertion:      Previously Visualized
 Amniotic Fluid
 AFI FV:      Subjectively upper-normal

 AFI Sum(cm)     %Tile       Largest Pocket(cm)
 22              83

 RUQ(cm)       RLQ(cm)       LUQ(cm)        LLQ(cm)

Biophysical Evaluation

 Amniotic F.V:   Pocket => 2 cm             F. Tone:        Observed
 F. Movement:    Observed                   Score:          [DATE]
 F. Breathing:   Observed
OB History

 Gravidity:    3         Term:   1         SAB:   1
 TOP:          1        Living:  1
Gestational Age

 LMP:           35w 5d        Date:  06/26/21                 EDD:   04/02/22
 Best:          33w 6d     Det. By:  Early Ultrasound         EDD:   04/15/22
                                     (08/19/21)
Anatomy

 Ventricles:            Appears normal         Stomach:                Appears normal, left
                                                                       sided
 Thoracic:              Appears normal         Kidneys:                Appear normal
 Diaphragm:             Appears normal         Bladder:                Appears normal
Cervix Uterus Adnexa

 Cervix
 Not visualized (advanced GA >78wks)

 Uterus
 No abnormality visualized.

 Right Ovary
 Within normal limits.

 Left Ovary
 Within normal limits.

 Cul De Sac
 No free fluid seen.

 Adnexa
 No abnormality visualized.
Comments

 This patient was seen for a BPP due to maternal treatment
 with Suboxone and diet-controlled gestational diabetes.  I
 reviewed her fingerstick logs today.  They were mostly within
 normal limits.
 A biophysical profile performed today was [DATE].
 There was normal amniotic fluid noted on today's ultrasound
 exam.
 She will return in 1 week for another BPP.

## 2022-03-03 NOTE — Progress Notes (Signed)
Subjective:  ?Aimee Guzman is a 36 y.o. G4P0011 at [redacted]w[redacted]d being seen today for ongoing prenatal care.  She is currently monitored for the following issues for this high-risk pregnancy and has Cocaine abuse complicating pregnancy ([place]); Epilepsy ([place]); Genital herpes simplex; Opioid use disorder, mild, in sustained remission ([place]); Iron deficiency anemia secondary to inadequate dietary iron intake; Pregnancy-induced glucose intolerance; Gestational thrombocytopenia ([place]); Supervision of high risk pregnancy, antepartum; ADHD; Anxiety; Gestational diabetes mellitus (GDM); and History of hepatitis C on their problem list. ? ?Patient reports general discomforts of pregnancy.  Contractions: Irritability. Vag. Bleeding: None.  Movement: Present. Denies leaking of fluid.  ? ?The following portions of the patient's history were reviewed and updated as appropriate: allergies, current medications, past family history, past medical history, past social history, past surgical history and problem list. Problem list updated. ? ?Objective:  ? ?Vitals:  ? 03/03/22 1556  ?BP: 122/71  ?Pulse: 89  ?Weight: 168 lb (76.2 kg)  ? ? ?Fetal Status: Fetal Heart Rate (bpm): 148   Movement: Present    ? ?General:  Alert, oriented and cooperative. Patient is in no acute distress.  ?Skin: Skin is warm and dry. No rash noted.   ?Cardiovascular: Normal heart rate noted  ?Respiratory: Normal respiratory effort, no problems with respiration noted  ?Abdomen: Soft, gravid, appropriate for gestational age. Pain/Pressure: Present     ?Pelvic:  Cervical exam deferred        ?Extremities: Normal range of motion.  Edema: None  ?Mental Status: Normal mood and affect. Normal behavior. Normal judgment and thought content.  ? ?Urinalysis:     ? ?Assessment and Plan:  ?Pregnancy: G4P0011 at [redacted]w[redacted]d ? ?1. Supervision of high risk pregnancy, antepartum ?Stable ?U/S today ? ?2. Diet controlled gestational diabetes mellitus (GDM) in third trimester ?CBG's in goal  range ?U/S today ? ?3. Opioid use disorder, mild, in sustained remission ([place]) ? ? ?4. Benign gestational thrombocytopenia in third trimester [facility]) ?Stable ? ?5. History of hepatitis C ?Stable ? ?6. Genital herpes simplex, unspecified site ?Suppression at 36 weeks ? ?7. Cocaine abuse affecting pregnancy in third trimester [facility]) ?Stable on Suboxone ? ?Preterm labor symptoms and general obstetric precautions including but not limited to vaginal bleeding, contractions, leaking of fluid and fetal movement were reviewed in detail with the patient. ?Please refer to After Visit Summary for other counseling recommendations.  ?Return in about 2 weeks (around 03/17/2022) for OB visit, face to face, MD only. ? ? ?Chancy Milroy, MD ?

## 2022-03-03 NOTE — Patient Instructions (Signed)

## 2022-03-10 ENCOUNTER — Ambulatory Visit: Payer: BC Managed Care – PPO | Attending: Obstetrics and Gynecology

## 2022-03-10 ENCOUNTER — Ambulatory Visit: Payer: BC Managed Care – PPO | Admitting: *Deleted

## 2022-03-10 VITALS — BP 115/71 | HR 92

## 2022-03-10 DIAGNOSIS — G40909 Epilepsy, unspecified, not intractable, without status epilepticus: Secondary | ICD-10-CM

## 2022-03-10 DIAGNOSIS — O99353 Diseases of the nervous system complicating pregnancy, third trimester: Secondary | ICD-10-CM

## 2022-03-10 DIAGNOSIS — O99320 Drug use complicating pregnancy, unspecified trimester: Secondary | ICD-10-CM | POA: Insufficient documentation

## 2022-03-10 DIAGNOSIS — O99323 Drug use complicating pregnancy, third trimester: Secondary | ICD-10-CM | POA: Diagnosis not present

## 2022-03-10 DIAGNOSIS — O99119 Other diseases of the blood and blood-forming organs and certain disorders involving the immune mechanism complicating pregnancy, unspecified trimester: Secondary | ICD-10-CM

## 2022-03-10 DIAGNOSIS — O24410 Gestational diabetes mellitus in pregnancy, diet controlled: Secondary | ICD-10-CM

## 2022-03-10 DIAGNOSIS — B182 Chronic viral hepatitis C: Secondary | ICD-10-CM

## 2022-03-10 DIAGNOSIS — O98413 Viral hepatitis complicating pregnancy, third trimester: Secondary | ICD-10-CM

## 2022-03-10 DIAGNOSIS — F1120 Opioid dependence, uncomplicated: Secondary | ICD-10-CM | POA: Insufficient documentation

## 2022-03-10 DIAGNOSIS — D696 Thrombocytopenia, unspecified: Secondary | ICD-10-CM

## 2022-03-10 DIAGNOSIS — Z3A34 34 weeks gestation of pregnancy: Secondary | ICD-10-CM

## 2022-03-10 IMAGING — US US MFM FETAL BPP W/O NON-STRESS
1 series · 14 of 28 positions shown · non-contrast
Comparison: none

[Series 1: us mfm fetal bpp w/o non-stress · 40 acquisitions, 14 frames shown]
[im 2/40]
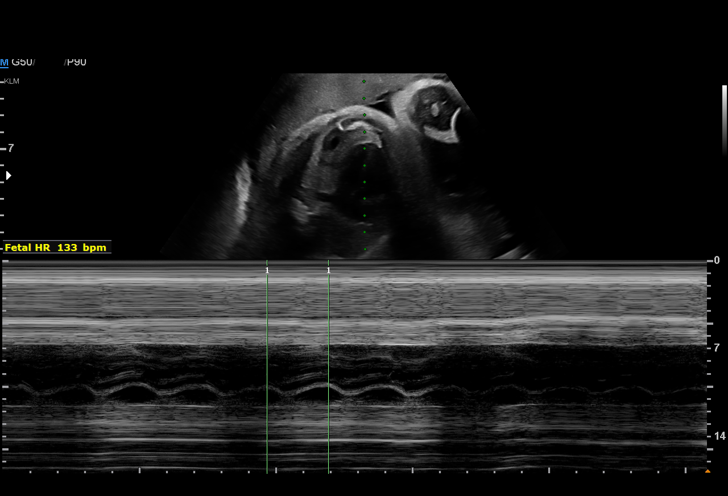
[im 5/40]
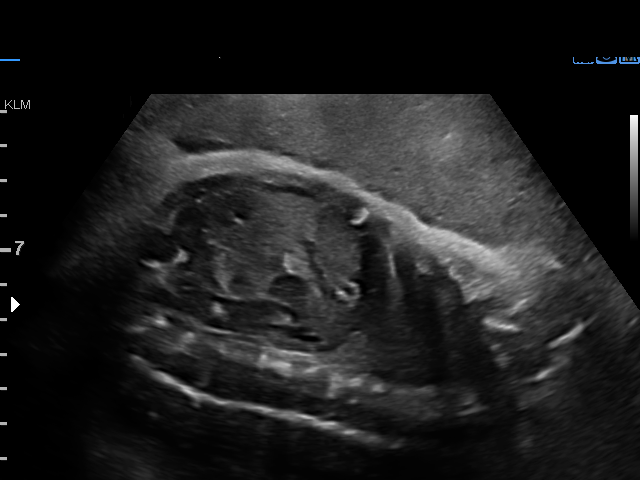
[im 8/40]
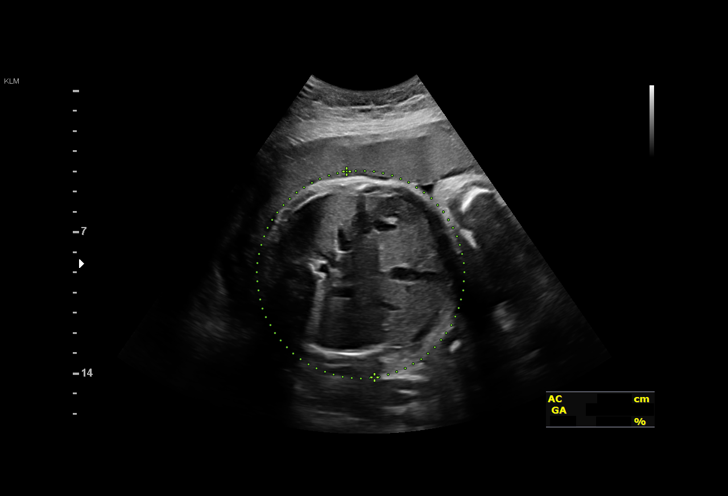
[im 11/40]
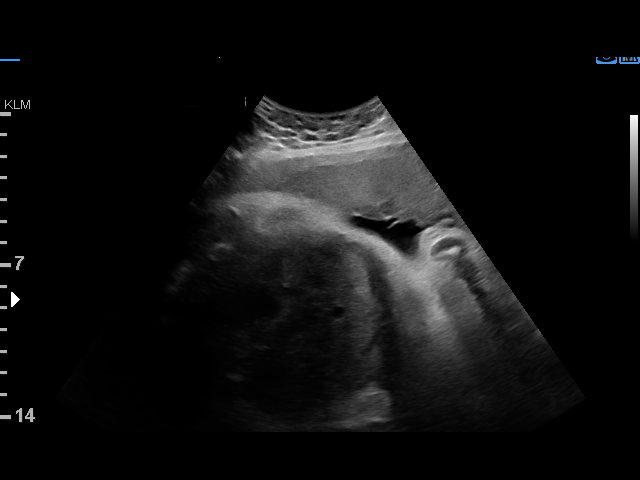
[im 14/40]
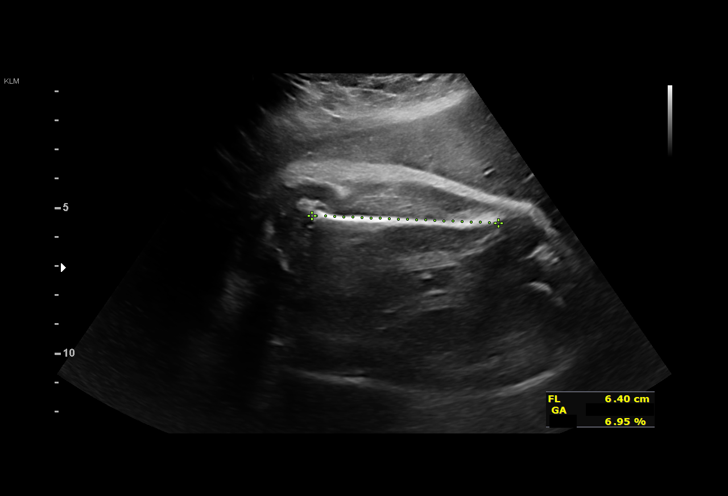
[im 16/40]
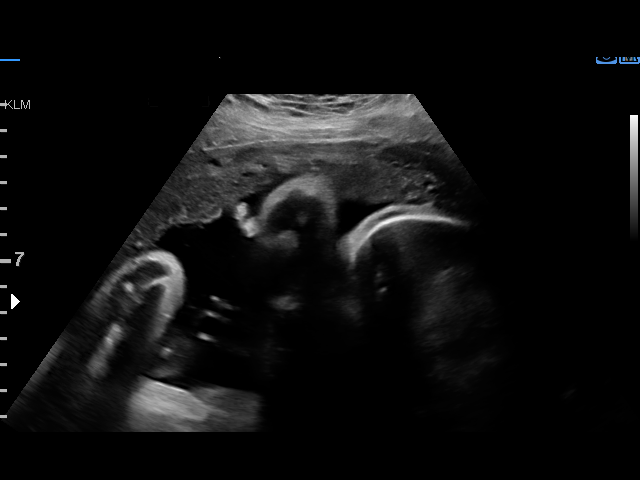
[im 19/40]
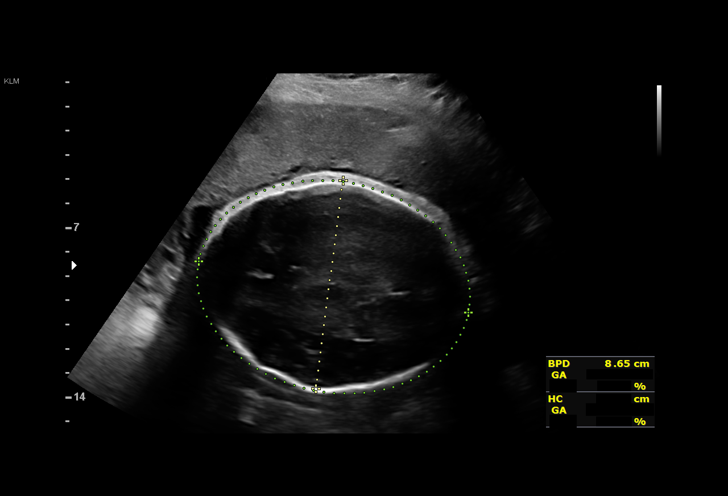
[im 22/40]
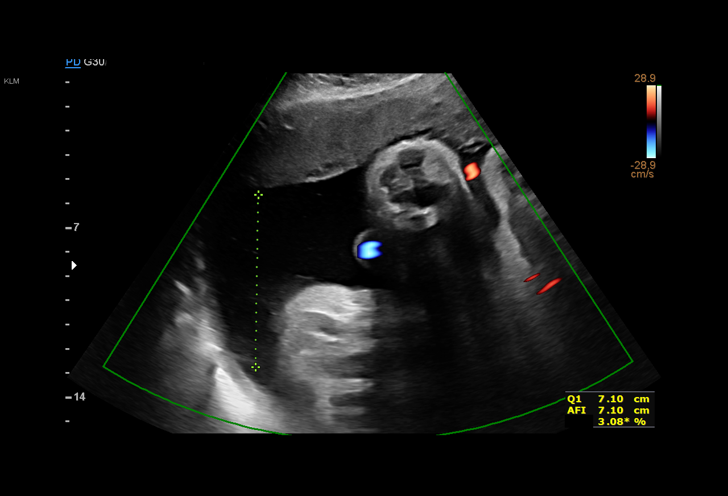
[im 25/40]
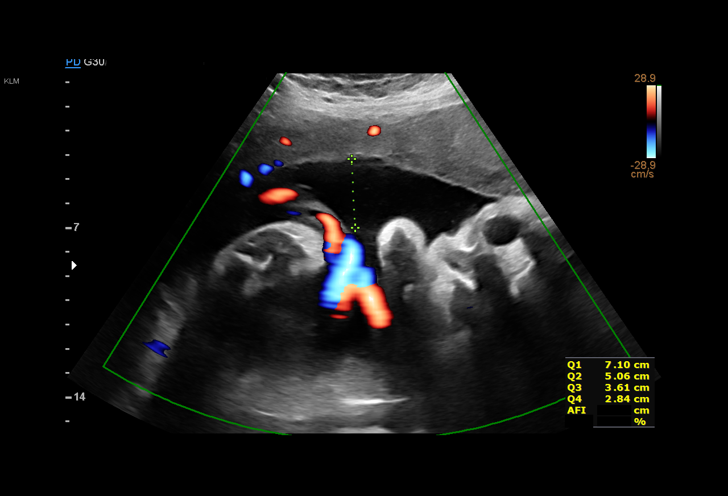
[im 28/40]
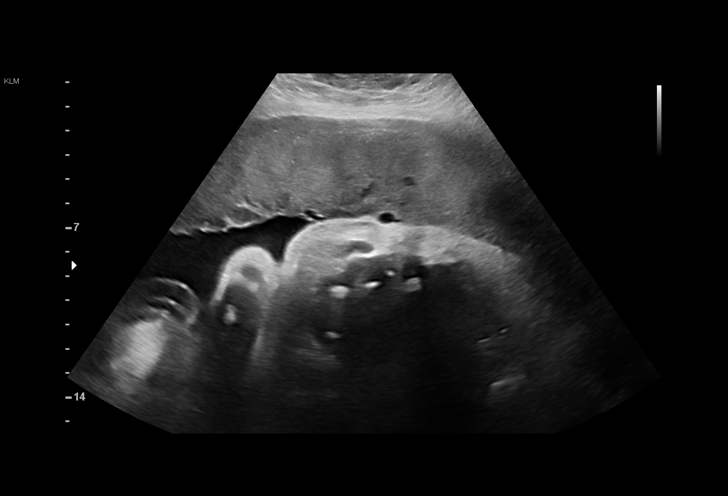
[im 31/40]
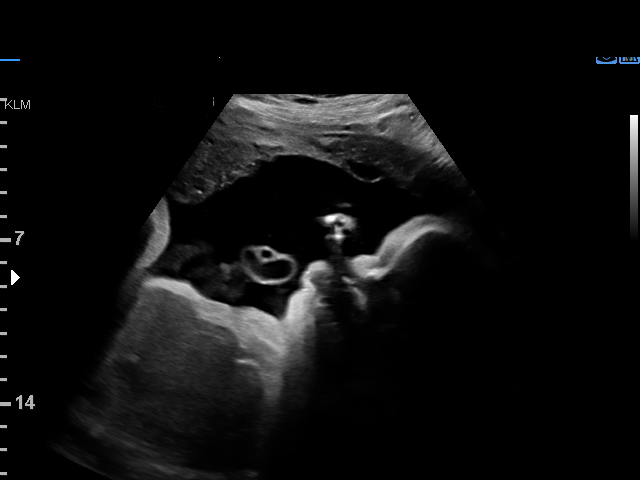
[im 34/40]
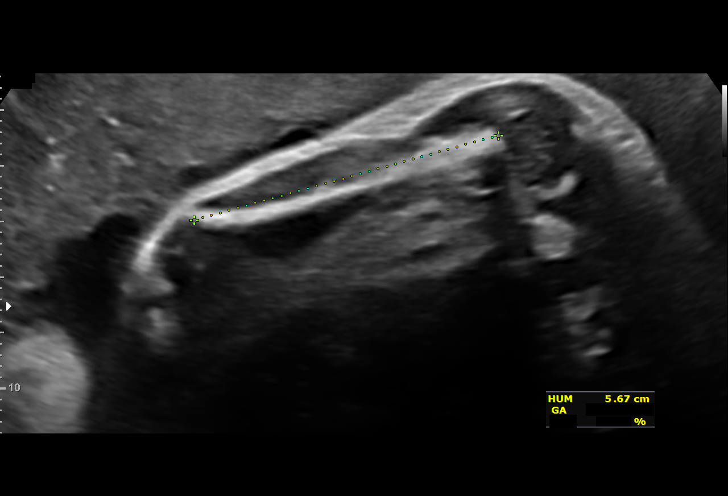
[im 37/40]
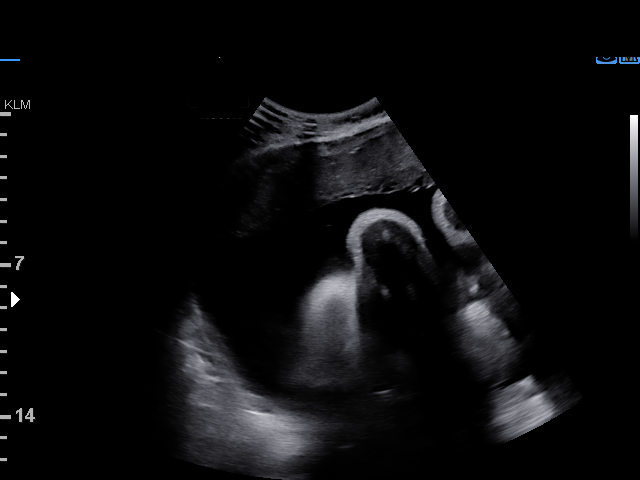
[im 40/40]
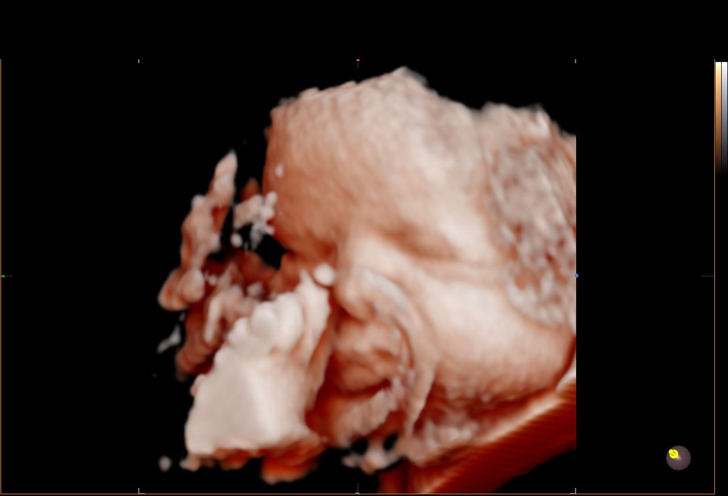

[14 of 28 positions shown; findings below may reference images not displayed]

Indications

 Gestational diabetes in pregnancy, diet
 controlled
 Chronic Hepatitis C complicating pregnancy,
 antepartum (cleared infection RNA neg 31
 wks)
 Suboxone use
 34 weeks gestation of pregnancy
 High risk pregnancy due to maternal drug
 abuse (Opiates & Marijuana)
 Epilepsy complicating pregnancy, childbirth,
 or the puerperium, antepartum
 Other mental disorder complicating
 pregnancy, third trimester
 Thrombocytopenia affecting pregnancy,          O99.119,
 antepartum
Fetal Evaluation

 Num Of Fetuses:         1
 Fetal Heart Rate(bpm):  133
 Cardiac Activity:       Observed
 Presentation:           Cephalic
 Placenta:               Anterior
 P. Cord Insertion:      Previously Visualized
 Amniotic Fluid
 AFI FV:      Within normal limits

 AFI Sum(cm)     %Tile       Largest Pocket(cm)
 18.61           69

 RUQ(cm)       RLQ(cm)       LUQ(cm)        LLQ(cm)

Biophysical Evaluation

 Amniotic F.V:   Within normal limits       F. Tone:        Observed
 F. Movement:    Observed                   Score:          [DATE]
 F. Breathing:   Observed
Biometry

 BPD:      86.1  mm     G. Age:  34w 5d         48  %    CI:        73.98   %    70 - 86
                                                         FL/HC:      20.9   %    20.1 -
 HC:      317.9  mm     G. Age:  35w 5d         38  %    HC/AC:      0.99        0.93 -
 AC:      319.5  mm     G. Age:  35w 6d         83  %    FL/BPD:     77.0   %    71 - 87
 FL:       66.3  mm     G. Age:  34w 1d         24  %    FL/AC:      20.8   %    20 - 24
 HUM:      56.3  mm     G. Age:  32w 5d         21  %

 Est. FW:    9702  gm    5 lb 13 oz      59  %
OB History

 Gravidity:    3         Term:   1         SAB:   1
 TOP:          1        Living:  1
Gestational Age

 LMP:           36w 5d        Date:  06/26/21                 EDD:   04/02/22
 U/S Today:     35w 1d                                        EDD:   04/13/22
 Best:          34w 6d     Det. By:  Early Ultrasound         EDD:   04/15/22
                                     (08/19/21)
Anatomy

 Cranium:               Appears normal         LVOT:                   Previously seen
 Cavum:                 Appears normal         Aortic Arch:            Previously seen
 Ventricles:            Appears normal         Ductal Arch:            Previously seen
 Choroid Plexus:        Previously seen        Diaphragm:              Appears normal
 Cerebellum:            Previously seen        Stomach:                Appears normal, left
                                                                       sided
 Posterior Fossa:       Previously seen        Abdomen:                Appears normal
 Nuchal Fold:           Not applicable (>20    Abdominal Wall:         Not well visualized
                        wks GA)
 Face:                  Orbits and profile     Cord Vessels:           Previously seen
                        previously seen
 Lips:                  Previously seen        Kidneys:                Appear normal
 Palate:                Previously seen        Bladder:                Appears normal
 Thoracic:              Appears normal         Spine:                  Previously seen
 Heart:                 Appears normal         Upper Extremities:      Previously seen
                        (4CH, axis, and
                        situs)
 RVOT:                  Previously seen        Lower Extremities:      Previously seen
 Other:  Female gender previously seen. Nasal bone, lenses, maxilla,
         mandible and falx, Heels/feet and open hands/5th digits, VC, 3VV
         and 3VTV visualized previously.
Cervix Uterus Adnexa

 Cervix
 Not visualized (advanced GA >27wks)

 Adnexa
 No abnormality visualized.
Comments

 This patient was seen for a BPP and growth scan due to
 maternal treatment with Suboxone and diet-controlled
 gestational diabetes.  She denies any problems since her last
 exam.
 The fetal growth and amniotic fluid level were appropriate for
 her gestational age.
 A biophysical profile performed today was [DATE].
 She will return in 1 week for another BPP.

## 2022-03-11 ENCOUNTER — Other Ambulatory Visit: Payer: Self-pay | Admitting: *Deleted

## 2022-03-11 DIAGNOSIS — O24419 Gestational diabetes mellitus in pregnancy, unspecified control: Secondary | ICD-10-CM

## 2022-03-11 DIAGNOSIS — F1120 Opioid dependence, uncomplicated: Secondary | ICD-10-CM

## 2022-03-17 ENCOUNTER — Ambulatory Visit: Payer: BC Managed Care – PPO | Attending: Obstetrics and Gynecology

## 2022-03-17 ENCOUNTER — Ambulatory Visit: Payer: BC Managed Care – PPO | Admitting: *Deleted

## 2022-03-17 VITALS — BP 119/65 | HR 83

## 2022-03-17 DIAGNOSIS — O24410 Gestational diabetes mellitus in pregnancy, diet controlled: Secondary | ICD-10-CM

## 2022-03-17 DIAGNOSIS — O99320 Drug use complicating pregnancy, unspecified trimester: Secondary | ICD-10-CM | POA: Insufficient documentation

## 2022-03-17 DIAGNOSIS — O99323 Drug use complicating pregnancy, third trimester: Secondary | ICD-10-CM | POA: Diagnosis not present

## 2022-03-17 DIAGNOSIS — F1120 Opioid dependence, uncomplicated: Secondary | ICD-10-CM

## 2022-03-17 DIAGNOSIS — O99353 Diseases of the nervous system complicating pregnancy, third trimester: Secondary | ICD-10-CM

## 2022-03-17 DIAGNOSIS — O99113 Other diseases of the blood and blood-forming organs and certain disorders involving the immune mechanism complicating pregnancy, third trimester: Secondary | ICD-10-CM

## 2022-03-17 DIAGNOSIS — D696 Thrombocytopenia, unspecified: Secondary | ICD-10-CM

## 2022-03-17 DIAGNOSIS — Z3A35 35 weeks gestation of pregnancy: Secondary | ICD-10-CM

## 2022-03-17 DIAGNOSIS — O98413 Viral hepatitis complicating pregnancy, third trimester: Secondary | ICD-10-CM | POA: Diagnosis not present

## 2022-03-17 DIAGNOSIS — B182 Chronic viral hepatitis C: Secondary | ICD-10-CM

## 2022-03-17 DIAGNOSIS — G40909 Epilepsy, unspecified, not intractable, without status epilepticus: Secondary | ICD-10-CM

## 2022-03-17 IMAGING — US US MFM FETAL BPP W/O NON-STRESS
1 series · 13 of 28 positions shown · non-contrast
Comparison: none

[Series 1: us mfm fetal bpp w/o non-stress · 37 acquisitions, 13 frames shown]
[im 2/37]
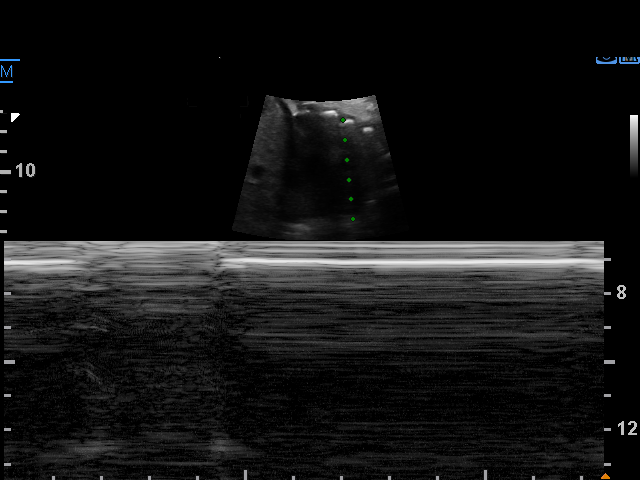
[im 5/37]
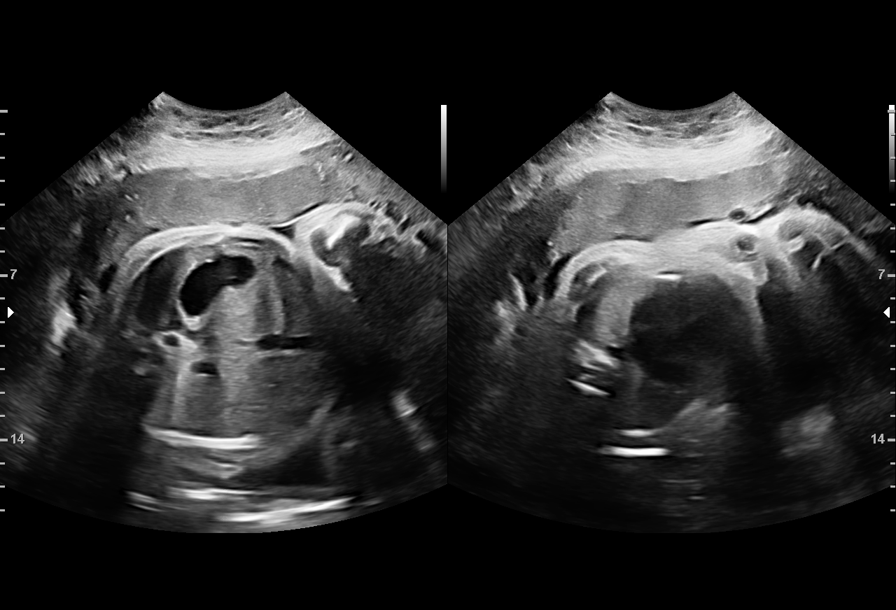
[im 7/37]
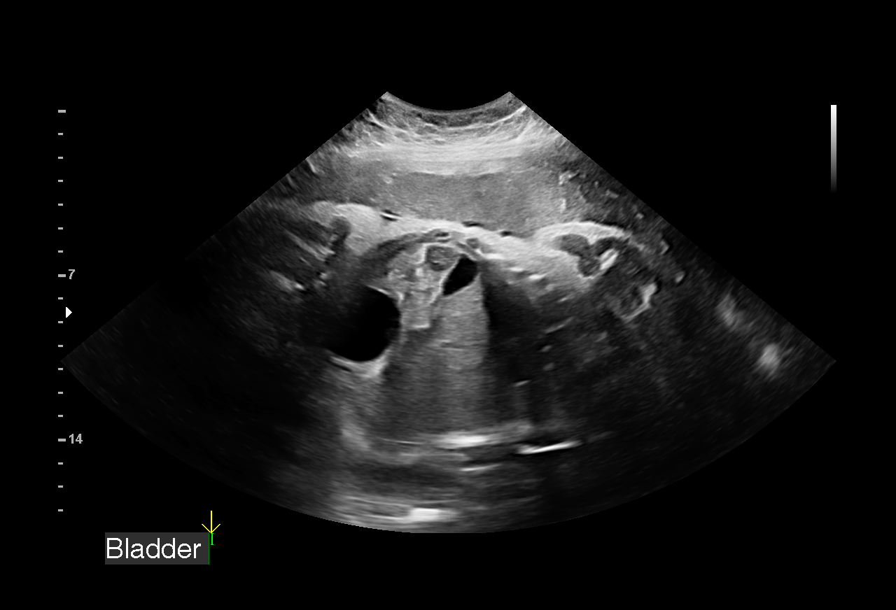
[im 10/37]
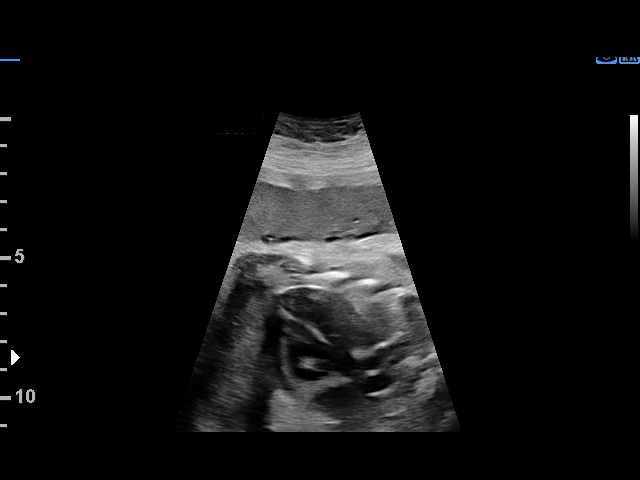
[im 13/37]
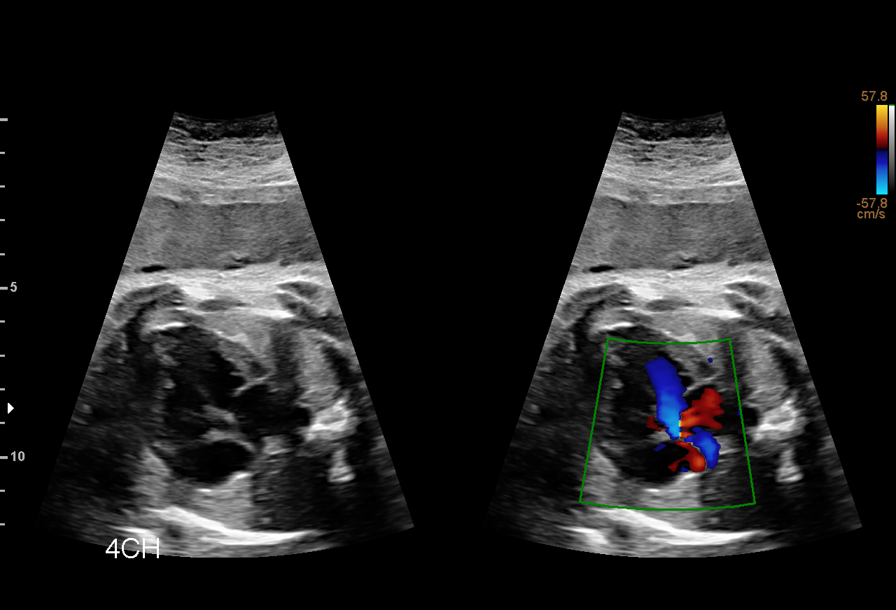
[im 15/37]
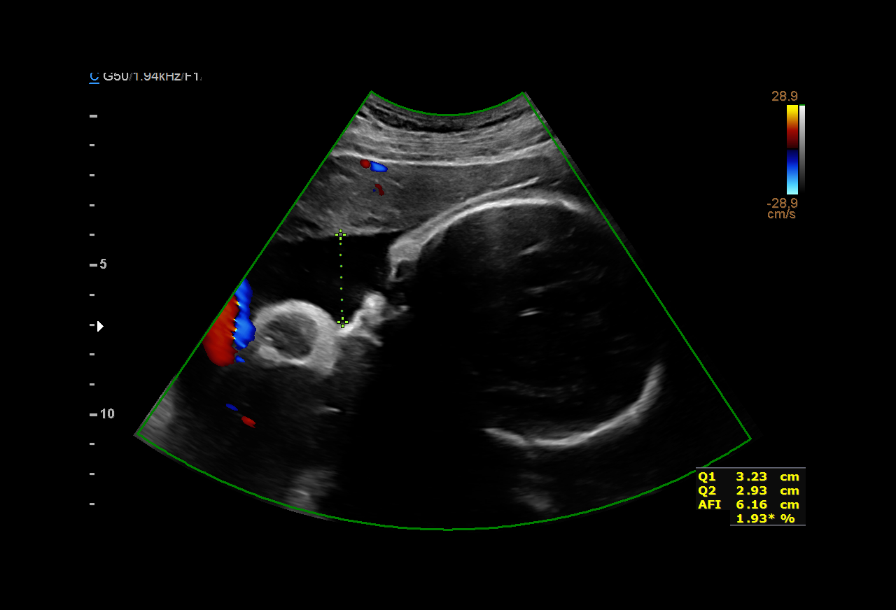
[im 19/37]
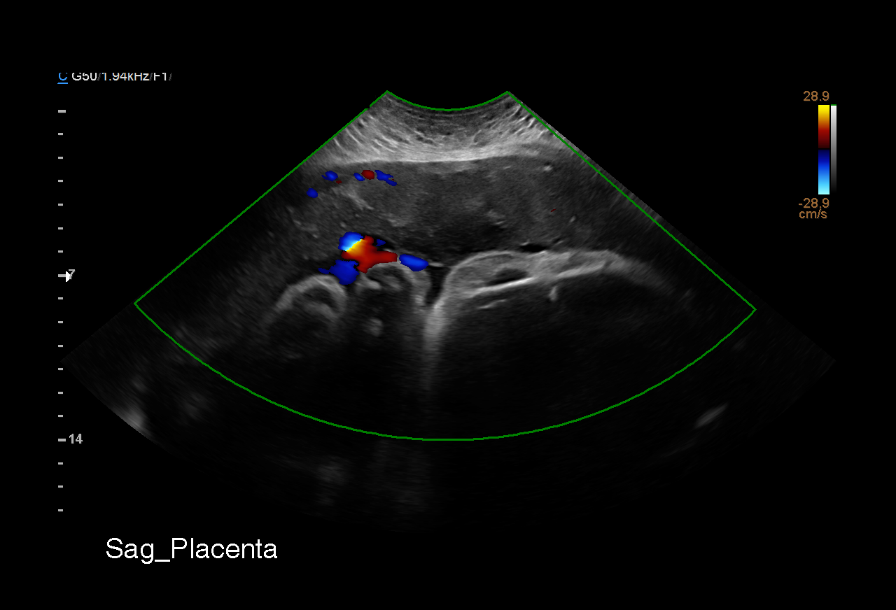
[im 22/37]
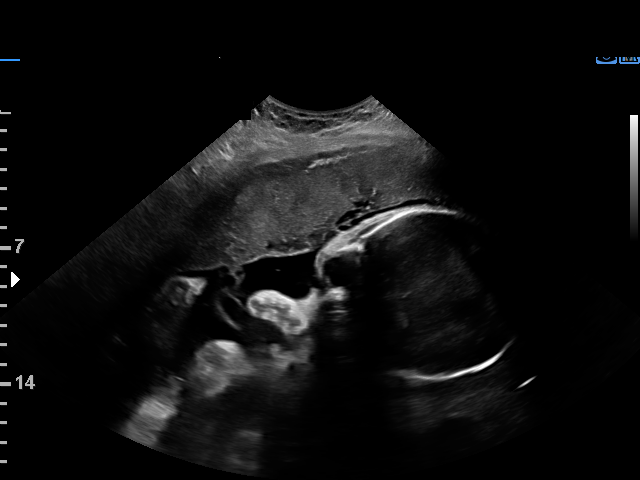
[im 25/37]
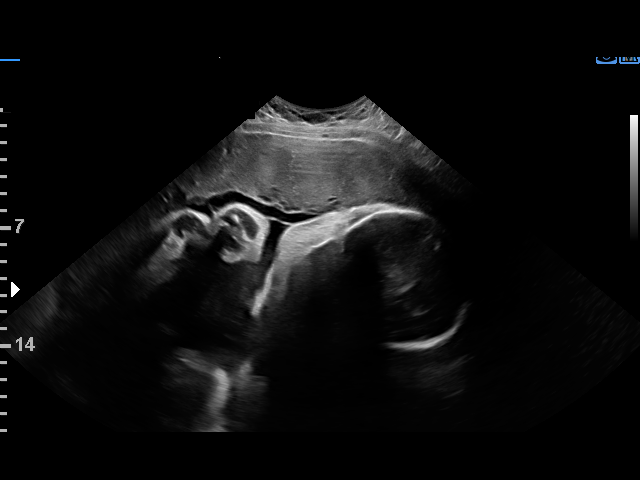
[im 27/37]
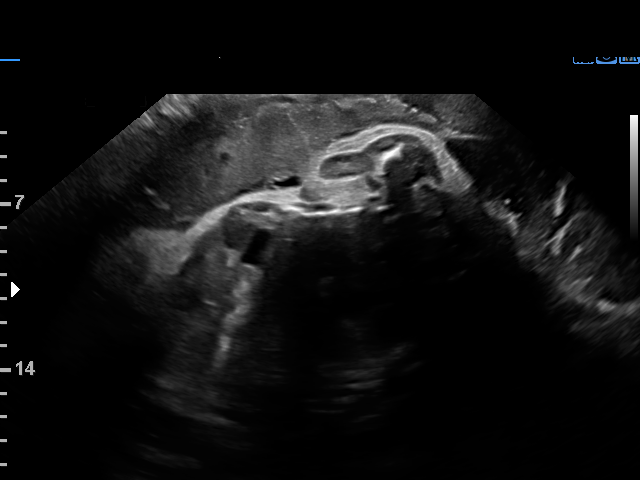
[im 30/37]
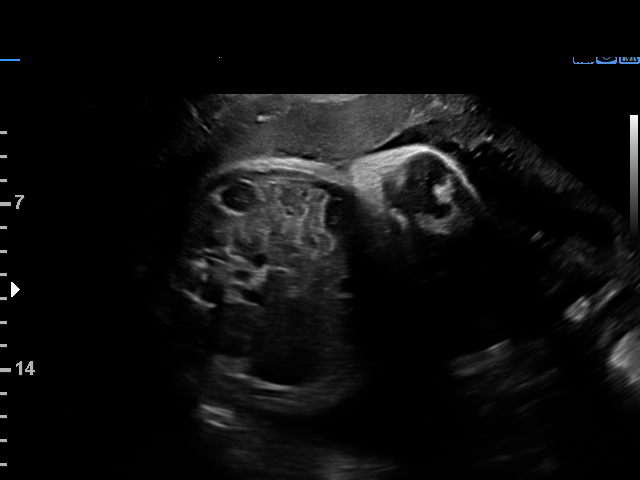
[im 33/37]
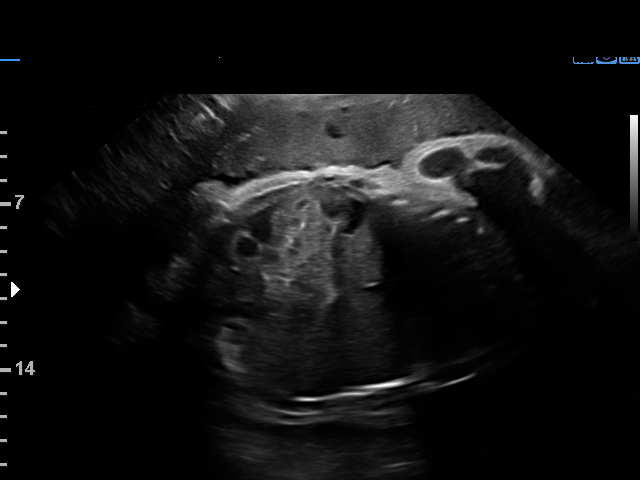
[im 35/37]
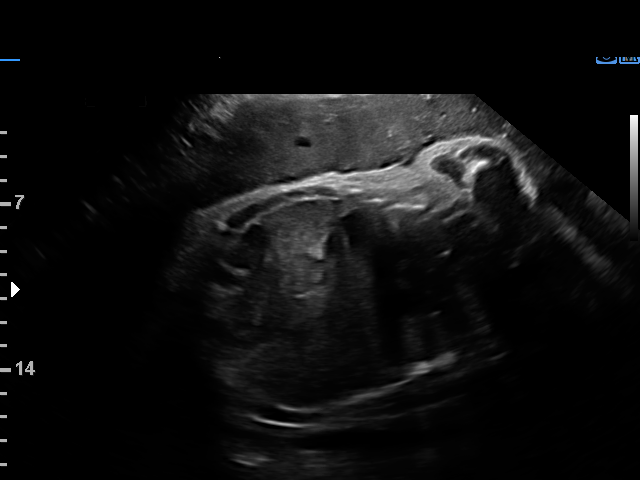

[13 of 28 positions shown; findings below may reference images not displayed]

Indications

 35 weeks gestation of pregnancy
 Gestational diabetes in pregnancy, diet
 controlled
 Chronic Hepatitis C complicating pregnancy,
 antepartum (cleared infection RNA neg 31
 wks)
 Suboxone use
 High risk pregnancy due to maternal drug
 abuse (Opiates & Marijuana)
 Epilepsy complicating pregnancy, childbirth,
 or the puerperium, antepartum
 Other mental disorder complicating
 pregnancy, third trimester
 Thrombocytopenia affecting pregnancy,          O99.119,
 antepartum
Fetal Evaluation

 Num Of Fetuses:         1
 Fetal Heart Rate(bpm):  135
 Cardiac Activity:       Observed
 Presentation:           Cephalic
 Placenta:               Anterior
 P. Cord Insertion:      Previously Visualized
 Amniotic Fluid
 AFI FV:      Within normal limits

 AFI Sum(cm)     %Tile       Largest Pocket(cm)
 16.39           60

 RUQ(cm)       RLQ(cm)       LUQ(cm)        LLQ(cm)

Biophysical Evaluation

 Amniotic F.V:   Within normal limits       F. Tone:        Observed
 F. Movement:    Observed                   Score:          [DATE]
 F. Breathing:   Observed
Biometry

 LV:        2.8  mm
OB History

 Gravidity:    3         Term:   1         SAB:   1
 TOP:          1        Living:  1
Gestational Age

 LMP:           37w 5d        Date:  06/26/21                 EDD:   04/02/22
 Best:          35w 6d     Det. By:  Early Ultrasound         EDD:   04/15/22
                                     (08/19/21)
Impression

 Antenatal testing performed given maternal 3UHVZ and
 suboxone exposure.
 The biophysical profile was [DATE] with good fetal movement and
 amniotic fluid volume.

Recommendations

 Continue weekly testing as previously scheduled.

## 2022-03-18 ENCOUNTER — Other Ambulatory Visit (HOSPITAL_COMMUNITY)
Admission: RE | Admit: 2022-03-18 | Discharge: 2022-03-18 | Disposition: A | Discharge status: Home / Self Care | Payer: BC Managed Care – PPO | Source: Ambulatory Visit | Attending: Family Medicine | Admitting: Family Medicine

## 2022-03-18 ENCOUNTER — Ambulatory Visit (INDEPENDENT_AMBULATORY_CARE_PROVIDER_SITE_OTHER): Payer: BC Managed Care – PPO | Admitting: Family Medicine

## 2022-03-18 VITALS — BP 141/89 | HR 112 | Wt 178.9 lb

## 2022-03-18 DIAGNOSIS — O0990 Supervision of high risk pregnancy, unspecified, unspecified trimester: Secondary | ICD-10-CM | POA: Diagnosis not present

## 2022-03-18 DIAGNOSIS — R030 Elevated blood-pressure reading, without diagnosis of hypertension: Secondary | ICD-10-CM

## 2022-03-18 DIAGNOSIS — O99113 Other diseases of the blood and blood-forming organs and certain disorders involving the immune mechanism complicating pregnancy, third trimester: Secondary | ICD-10-CM

## 2022-03-18 DIAGNOSIS — F1111 Opioid abuse, in remission: Secondary | ICD-10-CM

## 2022-03-18 DIAGNOSIS — Z8619 Personal history of other infectious and parasitic diseases: Secondary | ICD-10-CM

## 2022-03-18 DIAGNOSIS — D696 Thrombocytopenia, unspecified: Secondary | ICD-10-CM

## 2022-03-18 DIAGNOSIS — A6000 Herpesviral infection of urogenital system, unspecified: Secondary | ICD-10-CM

## 2022-03-18 DIAGNOSIS — O24410 Gestational diabetes mellitus in pregnancy, diet controlled: Secondary | ICD-10-CM

## 2022-03-18 MED ORDER — VALACYCLOVIR HCL 500 MG PO TABS: 500.0000 mg | ORAL_TABLET | Freq: Two times a day (BID) | ORAL | 2 refills | Status: DC

## 2022-03-18 MED ORDER — SUBOXONE 4-1 MG SL FILM: 0.5000 | ORAL_FILM | Freq: Every day | SUBLINGUAL | 0 refills | Status: DC

## 2022-03-18 NOTE — Progress Notes (Signed)
? ?  Subjective:  ?Aimee Guzman is a 36 y.o. G4P0011 at [redacted]w[redacted]d being seen today for ongoing prenatal care.  She is currently monitored for the following issues for this high-risk pregnancy and has Cocaine abuse complicating pregnancy (HCC); Epilepsy (HCC); Genital herpes simplex; Opioid use disorder, mild, in sustained remission (HCC); Iron deficiency anemia secondary to inadequate dietary iron intake; Pregnancy-induced glucose intolerance; Gestational thrombocytopenia (HCC); Supervision of high risk pregnancy, antepartum; ADHD; Anxiety; Gestational diabetes mellitus (GDM); and History of hepatitis C on their problem list. ? ?Patient reports no complaints.  Contractions: Irritability. Vag. Bleeding: None.  Movement: Present. Denies leaking of fluid.  ? ?The following portions of the patient's history were reviewed and updated as appropriate: allergies, current medications, past family history, past medical history, past social history, past surgical history and problem list. Problem list updated. ? ?Objective:  ? ?Vitals:  ? 03/18/22 1530  ?BP: (!) 141/89  ?Pulse: (!) 112  ?Weight: 178 lb 14.4 oz (81.1 kg)  ? ? ?Fetal Status: Fetal Heart Rate (bpm): 130   Movement: Present    ? ?General:  Alert, oriented and cooperative. Patient is in no acute distress.  ?Skin: Skin is warm and dry. No rash noted.   ?Cardiovascular: Normal heart rate noted  ?Respiratory: Normal respiratory effort, no problems with respiration noted  ?Abdomen: Soft, gravid, appropriate for gestational age. Pain/Pressure: Present     ?Pelvic: Vag. Bleeding: None     ?Cervical exam deferred        ?Extremities: Normal range of motion.     ?Mental Status: Normal mood and affect. Normal behavior. Normal judgment and thought content.  ? ?Urinalysis:     ? ?Assessment and Plan:  ?Pregnancy: G4P0011 at [redacted]w[redacted]d ? ?1. Supervision of high risk pregnancy, antepartum ?FHR normal ?BP elevated, see below ?Swabs today ?- Culture, beta strep (group b only) ?-  GC/Chlamydia probe amp ([place])not at [facility] ? ?2. Diet controlled gestational diabetes mellitus (GDM) in third trimester ?Well controlled with diet, ongoing intermittent elevated fastings due to late night snacking but otherwise at goal ?Last growth [place] 03/10/22, EFW 59%, AFI 18 ?BPP yesterday 8/8 ? ?3. History of hepatitis C ?Neg viral load c/w cleared infection ? ?4. Benign gestational thrombocytopenia in third trimester [facility]) ?Lab Results  ?Component Value Date  ? PLT 112 (L) 02/03/2022  ? ? ?5. Genital herpes simplex, unspecified site ?Start prophylaxis, rx sent today ? ?6. Opioid use disorder, mild, in sustained remission (HCC) ?Doing well on 2 mg daily ?Refill sent ?UDS today ?- ToxASSURE Select 13 (MW), Urine ? ?7. Elevated blood pressure without diagnosis of hypertension ?Not previously elevated ?Baseline labs collected ? ?Preterm labor symptoms and general obstetric precautions including but not limited to vaginal bleeding, contractions, leaking of fluid and fetal movement were reviewed in detail with the patient. ?Please refer to After Visit Summary for other counseling recommendations.  ?Return in 1 week (on 03/25/2022). ? ? ?Venora Maples, MD ? ?

## 2022-03-18 NOTE — Patient Instructions (Signed)

## 2022-03-19 LAB — PROTEIN / CREATININE RATIO, URINE
Creatinine, Urine: 77.1 mg/dL
Protein, Ur: 13.2 mg/dL
Protein/Creat Ratio: 171 mg/g creat (ref 0–200)

## 2022-03-19 LAB — CBC
Hematocrit: 33.6 % — ABNORMAL LOW (ref 34.0–46.6)
Hemoglobin: 12.1 g/dL (ref 11.1–15.9)
MCH: 31.8 pg (ref 26.6–33.0)
MCHC: 36 g/dL — ABNORMAL HIGH (ref 31.5–35.7)
MCV: 88 fL (ref 79–97)
Platelets: 101 10*3/uL — ABNORMAL LOW (ref 150–450)
RBC: 3.81 x10E6/uL (ref 3.77–5.28)
RDW: 12.3 % (ref 11.7–15.4)
WBC: 11.8 10*3/uL — ABNORMAL HIGH (ref 3.4–10.8)

## 2022-03-19 LAB — GC/CHLAMYDIA PROBE AMP (~~LOC~~) NOT AT ARMC
Chlamydia: NEGATIVE
Comment: NEGATIVE
Comment: NORMAL
Neisseria Gonorrhea: NEGATIVE

## 2022-03-19 LAB — COMPREHENSIVE METABOLIC PANEL
ALT: 11 IU/L (ref 0–32)
AST: 12 IU/L (ref 0–40)
Albumin/Globulin Ratio: 1.4 (ref 1.2–2.2)
Albumin: 3.5 g/dL — ABNORMAL LOW (ref 3.8–4.8)
Alkaline Phosphatase: 205 IU/L — ABNORMAL HIGH (ref 44–121)
BUN/Creatinine Ratio: 10 (ref 9–23)
BUN: 5 mg/dL — ABNORMAL LOW (ref 6–20)
Bilirubin Total: 0.4 mg/dL (ref 0.0–1.2)
CO2: 24 mmol/L (ref 20–29)
Calcium: 9.1 mg/dL (ref 8.7–10.2)
Chloride: 100 mmol/L (ref 96–106)
Creatinine, Ser: 0.52 mg/dL — ABNORMAL LOW (ref 0.57–1.00)
Globulin, Total: 2.5 g/dL (ref 1.5–4.5)
Glucose: 78 mg/dL (ref 70–99)
Potassium: 3.9 mmol/L (ref 3.5–5.2)
Sodium: 139 mmol/L (ref 134–144)
Total Protein: 6 g/dL (ref 6.0–8.5)
eGFR: 125 mL/min/{1.73_m2} (ref >59)

## 2022-03-20 ENCOUNTER — Telehealth: Payer: Self-pay

## 2022-03-20 DIAGNOSIS — R030 Elevated blood-pressure reading, without diagnosis of hypertension: Secondary | ICD-10-CM

## 2022-03-20 NOTE — Telephone Encounter (Signed)
Called patient to notify her of her upcoming appointment for BP check and lab work. Patient did not answer, left voicemail asking patient to call us back for information on this appointment or to check her mychart. Sent patient mychart message with appointment information.  ? Nolberto Hanlon, RN ?03/20/22 ?

## 2022-03-22 ENCOUNTER — Encounter: Payer: Self-pay | Admitting: Family Medicine

## 2022-03-22 LAB — CULTURE, BETA STREP (GROUP B ONLY): Strep Gp B Culture: NEGATIVE

## 2022-03-23 ENCOUNTER — Ambulatory Visit: Payer: BC Managed Care – PPO | Attending: Obstetrics

## 2022-03-23 ENCOUNTER — Ambulatory Visit (INDEPENDENT_AMBULATORY_CARE_PROVIDER_SITE_OTHER): Payer: BC Managed Care – PPO

## 2022-03-23 ENCOUNTER — Ambulatory Visit: Payer: BC Managed Care – PPO | Admitting: *Deleted

## 2022-03-23 ENCOUNTER — Encounter: Payer: Self-pay | Admitting: *Deleted

## 2022-03-23 VITALS — BP 120/65 | HR 79

## 2022-03-23 VITALS — BP 130/77 | HR 82 | Wt 180.2 lb

## 2022-03-23 DIAGNOSIS — R030 Elevated blood-pressure reading, without diagnosis of hypertension: Secondary | ICD-10-CM | POA: Diagnosis not present

## 2022-03-23 DIAGNOSIS — O99320 Drug use complicating pregnancy, unspecified trimester: Secondary | ICD-10-CM | POA: Insufficient documentation

## 2022-03-23 DIAGNOSIS — F1190 Opioid use, unspecified, uncomplicated: Secondary | ICD-10-CM

## 2022-03-23 DIAGNOSIS — Z3A36 36 weeks gestation of pregnancy: Secondary | ICD-10-CM

## 2022-03-23 DIAGNOSIS — O24419 Gestational diabetes mellitus in pregnancy, unspecified control: Secondary | ICD-10-CM | POA: Diagnosis not present

## 2022-03-23 DIAGNOSIS — O24410 Gestational diabetes mellitus in pregnancy, diet controlled: Secondary | ICD-10-CM

## 2022-03-23 DIAGNOSIS — O98413 Viral hepatitis complicating pregnancy, third trimester: Secondary | ICD-10-CM

## 2022-03-23 DIAGNOSIS — F1120 Opioid dependence, uncomplicated: Secondary | ICD-10-CM | POA: Diagnosis not present

## 2022-03-23 DIAGNOSIS — O99113 Other diseases of the blood and blood-forming organs and certain disorders involving the immune mechanism complicating pregnancy, third trimester: Secondary | ICD-10-CM

## 2022-03-23 DIAGNOSIS — O99323 Drug use complicating pregnancy, third trimester: Secondary | ICD-10-CM

## 2022-03-23 DIAGNOSIS — D696 Thrombocytopenia, unspecified: Secondary | ICD-10-CM

## 2022-03-23 DIAGNOSIS — G40909 Epilepsy, unspecified, not intractable, without status epilepticus: Secondary | ICD-10-CM

## 2022-03-23 DIAGNOSIS — B182 Chronic viral hepatitis C: Secondary | ICD-10-CM

## 2022-03-23 DIAGNOSIS — O0990 Supervision of high risk pregnancy, unspecified, unspecified trimester: Secondary | ICD-10-CM

## 2022-03-23 DIAGNOSIS — O99353 Diseases of the nervous system complicating pregnancy, third trimester: Secondary | ICD-10-CM

## 2022-03-23 NOTE — Progress Notes (Signed)
Patient is here today for blood pressure check and blood work. Patient is [redacted]w[redacted]d. BP today is 130/77. Patient denies any headache, dizziness, blurred vision, shortness of breath. I reviewed signs and symptoms of pre-eclampsia with patient. I encouraged patient to continue checking blood pressure at home and chart it in the babyscripts app.  ? ?Patient states she believes she lost her mucous plug yesterday around 3 PM. Per patient every time she has gone to the bathroom and wipes she has had some pink tinged mucous on the toilet paper. Patient denies noting any leakage of fluid. Patient states that this afternoon she began having contractions about every 10 minutes. Per patient she has experienced about 3-4 of these contractions. Dr. Dione Plover came to speak with patient. He reviewed signs and symptoms of labor and also indicated when patient should go to the [facility]. Patient verbalized understanding and denies any other questions.  ? ? ?Paulina Fusi, RN ?03/23/22 ?

## 2022-03-24 ENCOUNTER — Encounter: Payer: Self-pay | Admitting: Family Medicine

## 2022-03-24 LAB — CBC
Hematocrit: 34 % (ref 34.0–46.6)
Hemoglobin: 11.8 g/dL (ref 11.1–15.9)
MCH: 31.6 pg (ref 26.6–33.0)
MCHC: 34.7 g/dL (ref 31.5–35.7)
MCV: 91 fL (ref 79–97)
Platelets: 96 10*3/uL — CL (ref 150–450)
RBC: 3.73 x10E6/uL — ABNORMAL LOW (ref 3.77–5.28)
RDW: 12.5 % (ref 11.7–15.4)
WBC: 11.9 10*3/uL — ABNORMAL HIGH (ref 3.4–10.8)

## 2022-03-24 LAB — COMPREHENSIVE METABOLIC PANEL
ALT: 16 IU/L (ref 0–32)
AST: 17 IU/L (ref 0–40)
Albumin/Globulin Ratio: 1.6 (ref 1.2–2.2)
Albumin: 3.5 g/dL — ABNORMAL LOW (ref 3.8–4.8)
Alkaline Phosphatase: 203 IU/L — ABNORMAL HIGH (ref 44–121)
BUN/Creatinine Ratio: 16 (ref 9–23)
BUN: 8 mg/dL (ref 6–20)
Bilirubin Total: 0.3 mg/dL (ref 0.0–1.2)
CO2: 22 mmol/L (ref 20–29)
Calcium: 9.2 mg/dL (ref 8.7–10.2)
Chloride: 102 mmol/L (ref 96–106)
Creatinine, Ser: 0.5 mg/dL — ABNORMAL LOW (ref 0.57–1.00)
Globulin, Total: 2.2 g/dL (ref 1.5–4.5)
Glucose: 87 mg/dL (ref 70–99)
Potassium: 3.9 mmol/L (ref 3.5–5.2)
Sodium: 140 mmol/L (ref 134–144)
Total Protein: 5.7 g/dL — ABNORMAL LOW (ref 6.0–8.5)
eGFR: 126 mL/min/{1.73_m2} (ref >59)

## 2022-03-24 NOTE — Telephone Encounter (Signed)
Called patient back. Discussed that her platelets are essentially around the same thing. Discussed w Dr. Ilda Basset as well, agreed that this is likely due to her gestational thrombocytopenia and she is OK to wait until Thursday when she has her next appt before being re-evaluated. At that time if BP elevated would meet criteria for gHTN and would need to be admitted. Otherwise if BP fine should recheck her platelets though would also be reasonable to delay until next week as well.  ? ?Discussed concern about phlebotomy with me as well, will discuss with clinic leadership.  ?

## 2022-03-25 LAB — TOXASSURE SELECT 13 (MW), URINE

## 2022-03-25 LAB — PROTEIN / CREATININE RATIO, URINE
Creatinine, Urine: 83.7 mg/dL
Protein, Ur: 17.7 mg/dL
Protein/Creat Ratio: 211 mg/g creat — ABNORMAL HIGH (ref 0–200)

## 2022-03-26 ENCOUNTER — Ambulatory Visit (INDEPENDENT_AMBULATORY_CARE_PROVIDER_SITE_OTHER): Payer: BC Managed Care – PPO | Admitting: Family Medicine

## 2022-03-26 ENCOUNTER — Encounter: Payer: Self-pay | Admitting: Family Medicine

## 2022-03-26 VITALS — BP 120/76 | HR 88 | Wt 184.9 lb

## 2022-03-26 DIAGNOSIS — O0990 Supervision of high risk pregnancy, unspecified, unspecified trimester: Secondary | ICD-10-CM

## 2022-03-26 DIAGNOSIS — D696 Thrombocytopenia, unspecified: Secondary | ICD-10-CM

## 2022-03-26 DIAGNOSIS — Z8619 Personal history of other infectious and parasitic diseases: Secondary | ICD-10-CM

## 2022-03-26 DIAGNOSIS — O24410 Gestational diabetes mellitus in pregnancy, diet controlled: Secondary | ICD-10-CM

## 2022-03-26 DIAGNOSIS — O99113 Other diseases of the blood and blood-forming organs and certain disorders involving the immune mechanism complicating pregnancy, third trimester: Secondary | ICD-10-CM

## 2022-03-26 DIAGNOSIS — F1111 Opioid abuse, in remission: Secondary | ICD-10-CM

## 2022-03-26 NOTE — Patient Instructions (Signed)

## 2022-03-27 NOTE — Progress Notes (Signed)
? ?  PRENATAL VISIT NOTE ? ?Subjective:  ?Aimee Guzman is a 36 y.o. G3P1011 at [redacted]w[redacted]d being seen today for ongoing prenatal care.  She is currently monitored for the following issues for this high-risk pregnancy and has Cocaine abuse complicating pregnancy ([place]); Epilepsy ([place]); Genital herpes simplex; Opioid use disorder, mild, in sustained remission ([place]); Iron deficiency anemia secondary to inadequate dietary iron intake; Pregnancy-induced glucose intolerance; Gestational thrombocytopenia ([place]); Supervision of high risk pregnancy, antepartum; ADHD; Anxiety; Gestational diabetes mellitus (GDM); and History of hepatitis C on their problem list. ? ?Patient reports occasional headache.  Contractions: Irritability. Vag. Bleeding: None.  Movement: Present. Denies leaking of fluid.  ? ?The following portions of the patient's history were reviewed and updated as appropriate: allergies, current medications, past family history, past medical history, past social history, past surgical history and problem list.  ? ?Objective:  ? ?Vitals:  ? 03/26/22 1600  ?BP: 120/76  ?Pulse: 88  ?Weight: 184 lb 14.4 oz (83.9 kg)  ? ? ?Fetal Status: Fetal Heart Rate (bpm): 138   Movement: Present    ? ?General:  Alert, oriented and cooperative. Patient is in no acute distress.  ?Skin: Skin is warm and dry. No rash noted.   ?Cardiovascular: Normal heart rate noted  ?Respiratory: Normal respiratory effort, no problems with respiration noted  ?Abdomen: Soft, gravid, appropriate for gestational age.  Pain/Pressure: Present     ?Pelvic: Cervical exam deferred        ?Extremities: Normal range of motion.  Edema: Mild pitting, slight indentation  ?Mental Status: Normal mood and affect. Normal behavior. Normal judgment and thought content.  ? ?Assessment and Plan:  ?Pregnancy: G3P1011 at [redacted]w[redacted]d ?1. History of hepatitis C ? ? ?2. Diet controlled gestational diabetes mellitus (GDM) in third trimester ? ? ?CBGs look good except fastings, but not true  fastings, typically eats at 3 am. ?Continue diet ? ?3. Benign gestational thrombocytopenia in third trimester [facility]) ?BP is normal today--repeat labs next week ? ?4. Opioid use disorder, mild, in sustained remission ([place]) ?On Suboxone, doing well ? ?5. Supervision of high risk pregnancy, antepartum ? ? ?Term labor symptoms and general obstetric precautions including but not limited to vaginal bleeding, contractions, leaking of fluid and fetal movement were reviewed in detail with the patient. ?Please refer to After Visit Summary for other counseling recommendations.  ? ?Return in 1 week (on 04/02/2022). ? ?Future Appointments  ?Date Time Provider [place]  ?04/01/2022  1:30 PM [facility] NURSE [facility]  ?04/01/2022  1:45 PM [facility] US5 [facility]  ?04/01/2022  3:15 PM Radene Gunning, MD [facility]  ?04/08/2022  1:30 PM [facility] NURSE [facility]  ?04/08/2022  1:45 PM [facility] US5 [facility]  ?04/08/2022  3:15 PM Clarnce Flock, MD [facility]  ? ? ?Donnamae Jude, MD ? ?

## 2022-03-31 NOTE — Progress Notes (Signed)
? ?  PRENATAL VISIT NOTE ? ?Subjective:  ?Aimee Guzman is a 36 y.o. G3P1011 at [redacted]w[redacted]d being seen today for ongoing prenatal care.  She is currently monitored for the following issues for this high-risk pregnancy and has Cocaine abuse complicating pregnancy (HCC); Epilepsy (HCC); Genital herpes simplex; Opioid use disorder, mild, in sustained remission (HCC); Iron deficiency anemia secondary to inadequate dietary iron intake; Gestational thrombocytopenia (HCC); Supervision of high risk pregnancy, antepartum; ADHD; Anxiety; Gestational diabetes mellitus (GDM); and History of hepatitis C on their problem list. ? ?Patient reports no complaints.  Contractions: Irregular. Vag. Bleeding: None.  Movement: Present. Denies leaking of fluid.  ? ?The following portions of the patient's history were reviewed and updated as appropriate: allergies, current medications, past family history, past medical history, past social history, past surgical history and problem list.  ? ?Objective:  ? ?Vitals:  ? 04/01/22 1519  ?BP: 116/66  ?Pulse: 79  ?Weight: 182 lb 4.8 oz (82.7 kg)  ? ? ?Fetal Status: Fetal Heart Rate (bpm): 138   Movement: Present    ? ?General:  Alert, oriented and cooperative. Patient is in no acute distress.  ?Skin: Skin is warm and dry. No rash noted.   ?Cardiovascular: Normal heart rate noted  ?Respiratory: Normal respiratory effort, no problems with respiration noted  ?Abdomen: Soft, gravid, appropriate for gestational age.  Pain/Pressure: Absent     ?Pelvic: Cervical exam deferred        ?Extremities: Normal range of motion.     ?Mental Status: Normal mood and affect. Normal behavior. Normal judgment and thought content.  ? ?Assessment and Plan:  ?Pregnancy: G3P1011 at [redacted]w[redacted]d ?1. Diet controlled gestational diabetes mellitus (GDM) in third trimester ?Reviewed CBGs: numbers unchanged from before. Did not bring her log. She reports fastings sometimes elevated but still with late night snacking.  ?Growth on 4/11 was  59%ile, normal AC and AFI ? ?2. History of hepatitis C ?LFTs wnl ? ?3. Nonintractable epilepsy without status epilepticus, unspecified epilepsy type (HCC) ?No medications ? ?4. Genital herpes simplex, unspecified site ?Taking valtrex ? ?5. Benign gestational thrombocytopenia in third trimester [facility]) ?Recheck CBC today - platelets 96 on 4/24.  ? ?6. Opioid use disorder, mild, in sustained remission (HCC) ?Toxassure on 4/19 was appropriate per Dr. Crissie Reese.  ? ?7. Iron deficiency anemia secondary to inadequate dietary iron intake ?Resolved ? ?8. Supervision of high risk pregnancy, antepartum ?GBS neg ?BP today normal ?IOL scheduled for 5/11 at [redacted]w[redacted]d for gestational thrombocytopenia as well as GDM. Process reviewed with the patient.  ? ?Term labor symptoms and general obstetric precautions including but not limited to vaginal bleeding, contractions, leaking of fluid and fetal movement were reviewed in detail with the patient. ?Please refer to After Visit Summary for other counseling recommendations.  ? ?No follow-ups on file. ? ?Future Appointments  ?Date Time Provider Department Center  ?04/08/2022  1:30 PM [facility] NURSE [facility]  ?04/08/2022  1:45 PM [facility] US5 [facility]  ?04/08/2022  3:15 PM Venora Maples, MD [facility]  ? ? ?Milas Hock, MD ?

## 2022-04-01 ENCOUNTER — Ambulatory Visit (INDEPENDENT_AMBULATORY_CARE_PROVIDER_SITE_OTHER): Payer: BC Managed Care – PPO | Admitting: Obstetrics and Gynecology

## 2022-04-01 ENCOUNTER — Encounter: Payer: Self-pay | Admitting: *Deleted

## 2022-04-01 ENCOUNTER — Ambulatory Visit: Payer: BC Managed Care – PPO | Attending: Maternal & Fetal Medicine

## 2022-04-01 ENCOUNTER — Encounter: Payer: Self-pay | Admitting: Obstetrics and Gynecology

## 2022-04-01 ENCOUNTER — Ambulatory Visit: Payer: BC Managed Care – PPO | Admitting: *Deleted

## 2022-04-01 VITALS — BP 116/66 | HR 79 | Wt 182.3 lb

## 2022-04-01 DIAGNOSIS — O99320 Drug use complicating pregnancy, unspecified trimester: Secondary | ICD-10-CM

## 2022-04-01 DIAGNOSIS — O98413 Viral hepatitis complicating pregnancy, third trimester: Secondary | ICD-10-CM

## 2022-04-01 DIAGNOSIS — B182 Chronic viral hepatitis C: Secondary | ICD-10-CM

## 2022-04-01 DIAGNOSIS — O24410 Gestational diabetes mellitus in pregnancy, diet controlled: Secondary | ICD-10-CM | POA: Diagnosis not present

## 2022-04-01 DIAGNOSIS — A6000 Herpesviral infection of urogenital system, unspecified: Secondary | ICD-10-CM

## 2022-04-01 DIAGNOSIS — O0990 Supervision of high risk pregnancy, unspecified, unspecified trimester: Secondary | ICD-10-CM

## 2022-04-01 DIAGNOSIS — F1120 Opioid dependence, uncomplicated: Secondary | ICD-10-CM

## 2022-04-01 DIAGNOSIS — O99113 Other diseases of the blood and blood-forming organs and certain disorders involving the immune mechanism complicating pregnancy, third trimester: Secondary | ICD-10-CM | POA: Diagnosis not present

## 2022-04-01 DIAGNOSIS — Z3A38 38 weeks gestation of pregnancy: Secondary | ICD-10-CM

## 2022-04-01 DIAGNOSIS — F1990 Other psychoactive substance use, unspecified, uncomplicated: Secondary | ICD-10-CM

## 2022-04-01 DIAGNOSIS — G40909 Epilepsy, unspecified, not intractable, without status epilepticus: Secondary | ICD-10-CM

## 2022-04-01 DIAGNOSIS — F1111 Opioid abuse, in remission: Secondary | ICD-10-CM

## 2022-04-01 DIAGNOSIS — O99353 Diseases of the nervous system complicating pregnancy, third trimester: Secondary | ICD-10-CM

## 2022-04-01 DIAGNOSIS — O24419 Gestational diabetes mellitus in pregnancy, unspecified control: Secondary | ICD-10-CM | POA: Insufficient documentation

## 2022-04-01 DIAGNOSIS — D696 Thrombocytopenia, unspecified: Secondary | ICD-10-CM | POA: Diagnosis not present

## 2022-04-01 DIAGNOSIS — O99119 Other diseases of the blood and blood-forming organs and certain disorders involving the immune mechanism complicating pregnancy, unspecified trimester: Secondary | ICD-10-CM

## 2022-04-01 DIAGNOSIS — D508 Other iron deficiency anemias: Secondary | ICD-10-CM

## 2022-04-01 DIAGNOSIS — Z8619 Personal history of other infectious and parasitic diseases: Secondary | ICD-10-CM

## 2022-04-01 IMAGING — US US MFM FETAL BPP W/O NON-STRESS
1 series · 14 of 28 positions shown · non-contrast
Comparison: none

[Series 1: us mfm fetal bpp w/o non-stress · 42 acquisitions, 14 frames shown]
[im 2/42]
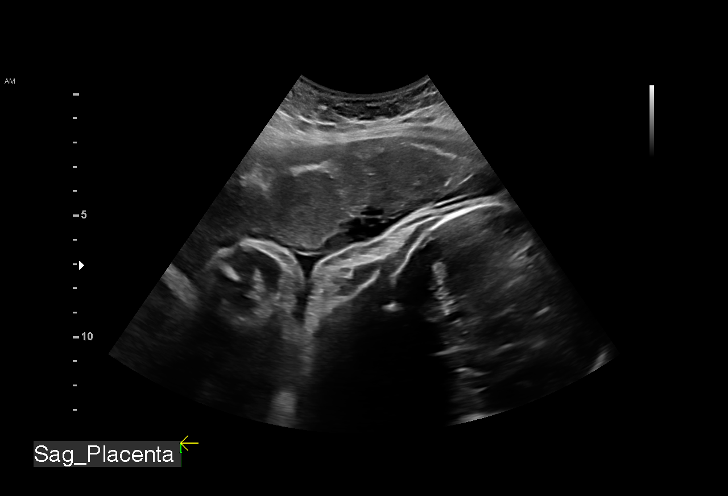
[im 5/42]
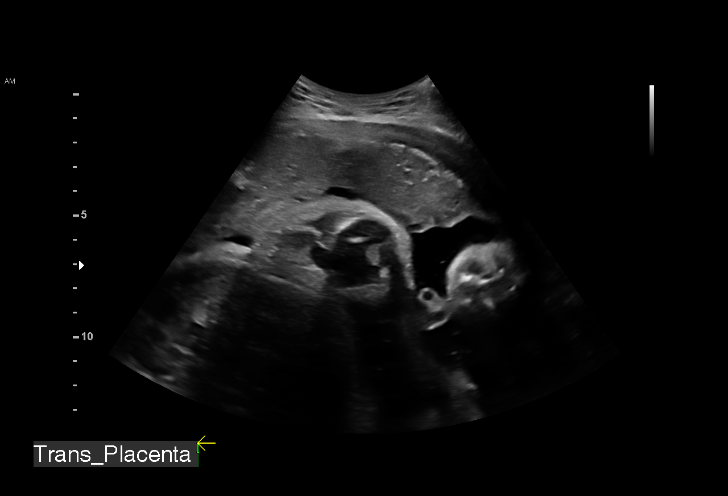
[im 8/42]
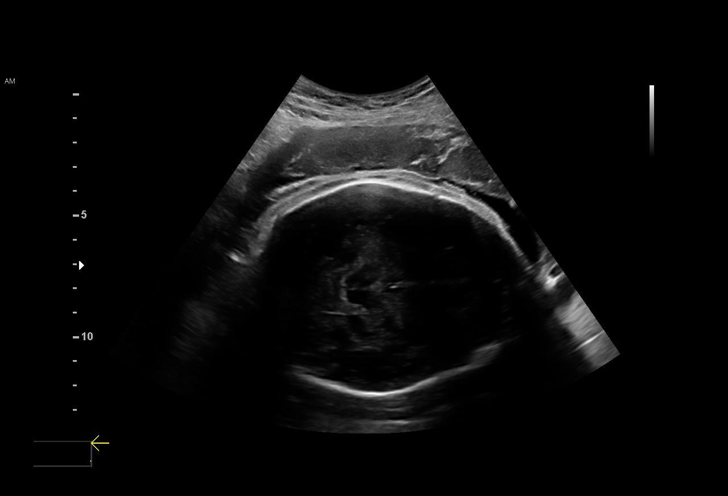
[im 11/42]
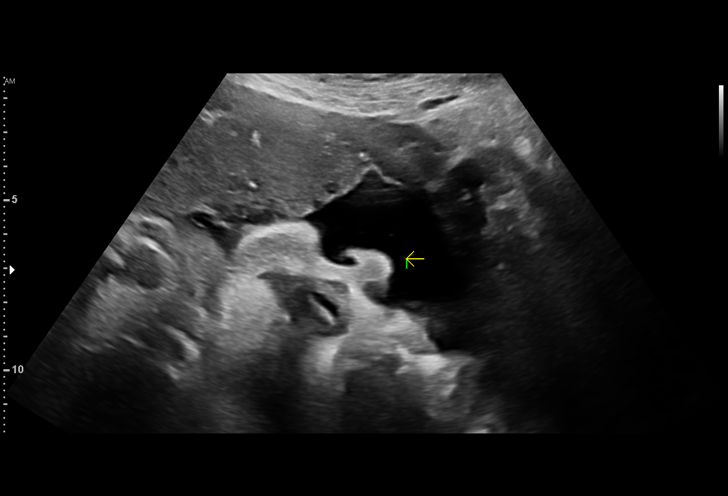
[im 14/42]
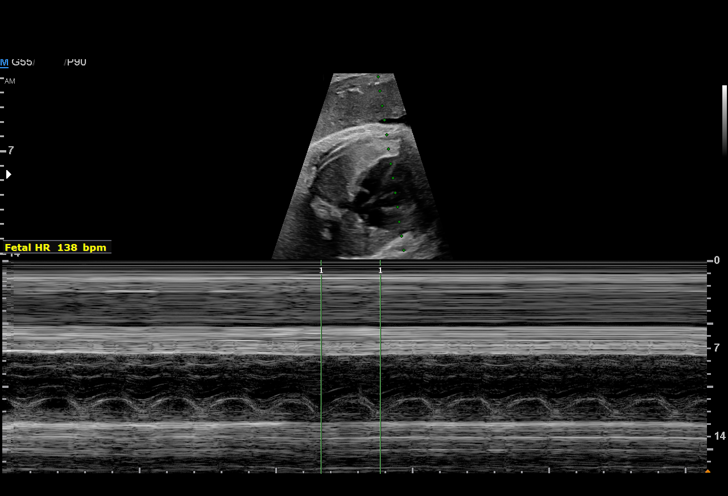
[im 17/42]
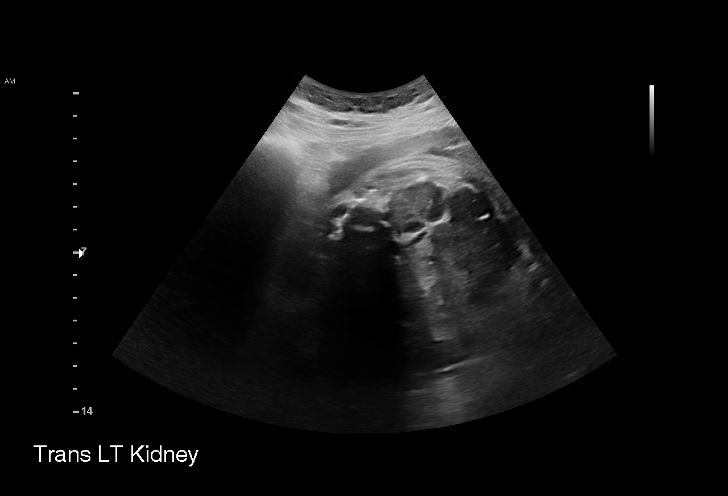
[im 20/42]
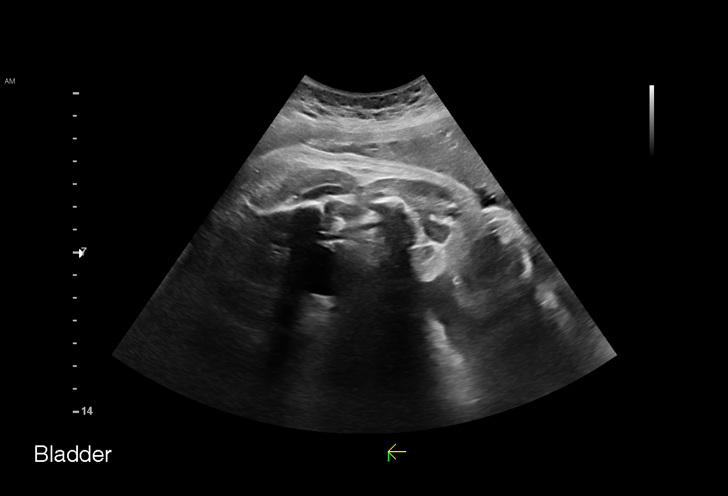
[im 23/42]
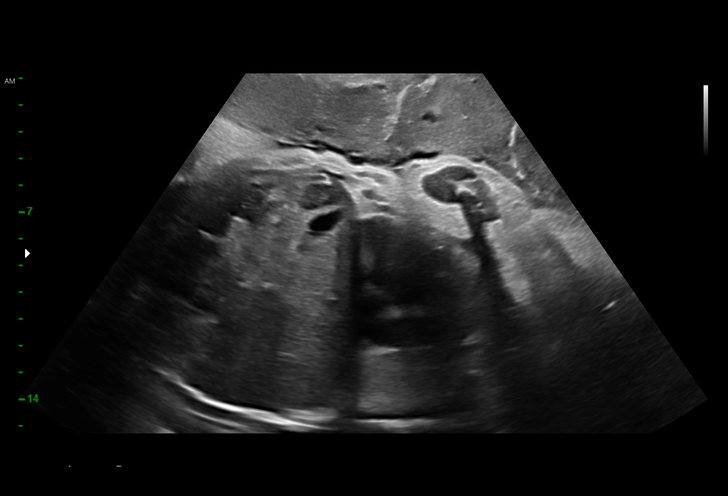
[im 26/42]
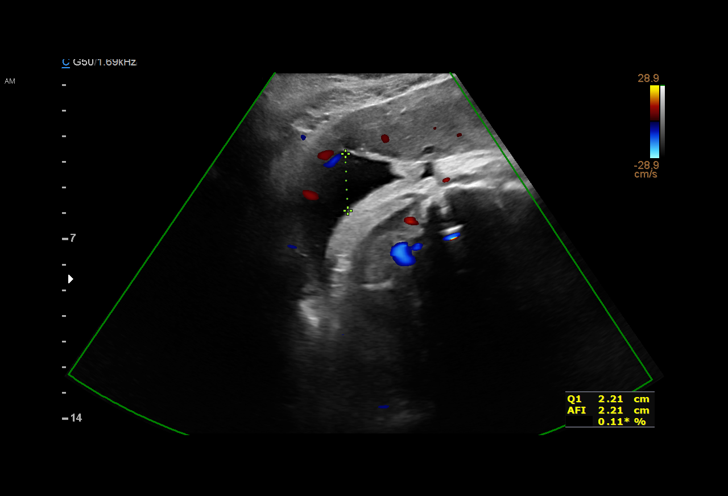
[im 29/42]
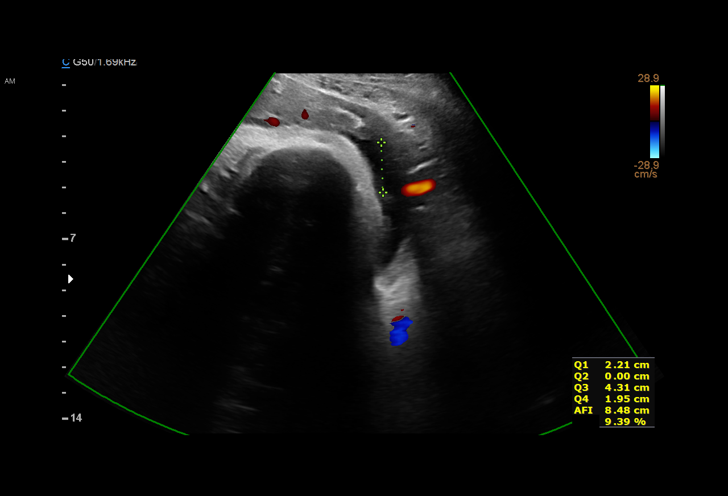
[im 32/42]
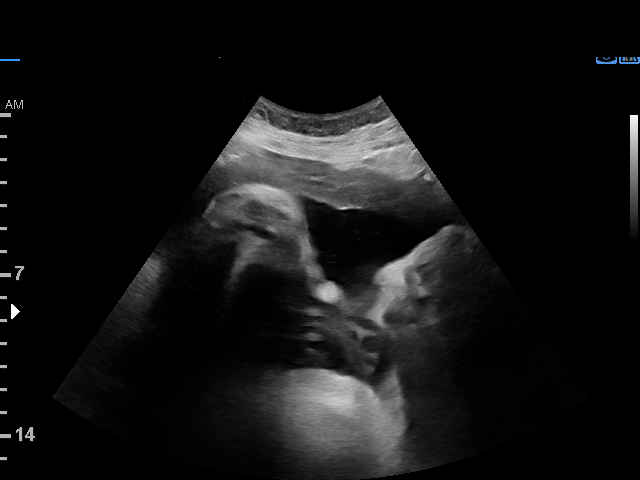
[im 35/42]
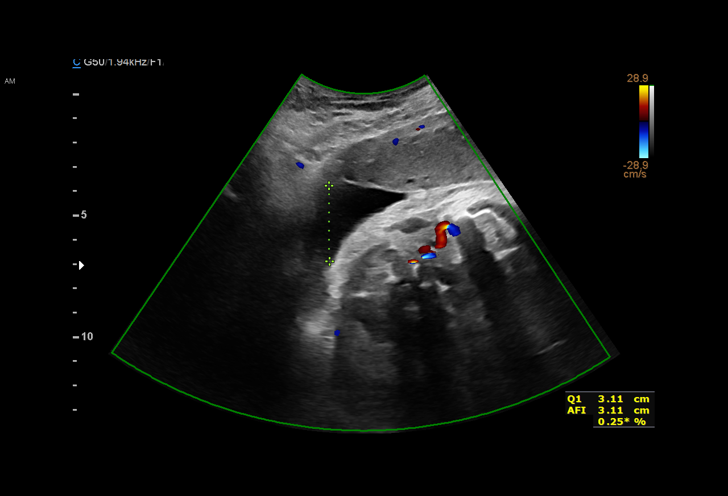
[im 38/42]
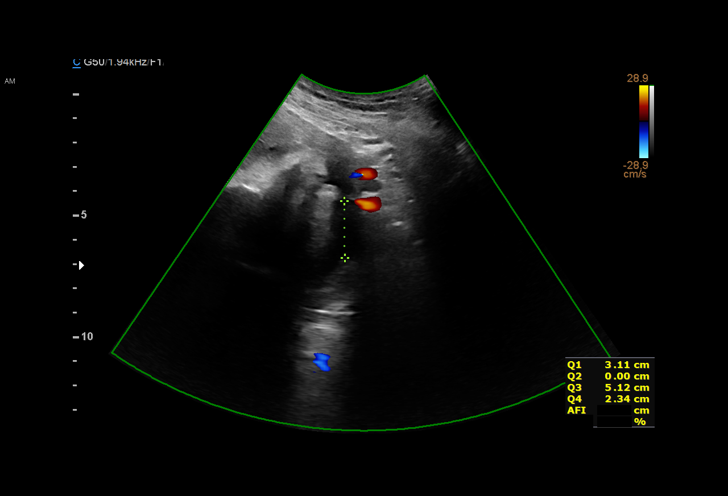
[im 42/42]
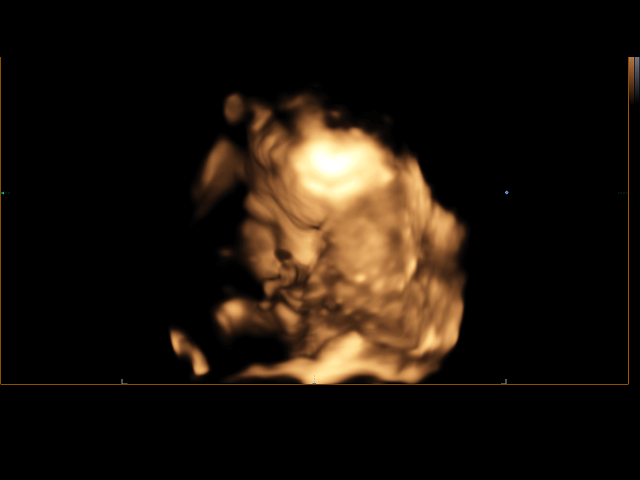

[14 of 28 positions shown; findings below may reference images not displayed]

Indications

 38 weeks gestation of pregnancy
 Gestational diabetes in pregnancy, diet
 controlled
 Chronic Hepatitis C complicating pregnancy,
 antepartum (cleared infection RNA neg 31
 wks)
 Suboxone use
 High risk pregnancy due to maternal drug
 abuse (Opiates & Marijuana)
 Epilepsy complicating pregnancy, childbirth,
 or the puerperium, antepartum
 Other mental disorder complicating
 pregnancy, third trimester
 Thrombocytopenia affecting pregnancy,          O99.119,
 antepartum
Fetal Evaluation

 Num Of Fetuses:         1
 Fetal Heart Rate(bpm):  138
 Cardiac Activity:       Observed
 Presentation:           Cephalic
 Placenta:               Anterior
 P. Cord Insertion:      Previously Visualized
 Amniotic Fluid
 AFI FV:      Within normal limits

 AFI Sum(cm)     %Tile       Largest Pocket(cm)
 10.57           30

 RUQ(cm)       RLQ(cm)       LUQ(cm)        LLQ(cm)
 3.11          0
Biophysical Evaluation

 Amniotic F.V:   Within normal limits       F. Tone:        Observed
 F. Movement:    Observed                   Score:          [DATE]
 F. Breathing:   Observed
Biometry

 LV:          3  mm
OB History

 Gravidity:    3         Term:   1         SAB:   1
 TOP:          1        Living:  1
Gestational Age

 LMP:           39w 6d        Date:  06/26/21                  EDD:   04/02/22
 Best:          38w 0d     Det. By:  Early Ultrasound         EDD:   04/15/22
                                     (08/19/21)
Anatomy

 Cranium:               Appears normal         Diaphragm:              Appears normal
 Cavum:                 Appears normal         Stomach:                Appears normal, left
                                                                       sided
 Ventricles:            Appears normal         Abdomen:                Appears normal
 Lips:                  Previously seen        Kidneys:                Appear normal
 Heart:                 Appears normal         Bladder:                Appears normal
                        (4CH, axis, and
                        situs)
Cervix Uterus Adnexa

 Cervix
 Not visualized (advanced GA >51wks)

 Uterus
 No abnormality visualized.

 Right Ovary
 Not visualized.

 Left Ovary
 Not visualized.
Impression
 Gestational diabetes. Well-controlled on diet .
 Suboxone maintenance.

 Amniotic fluid is normal and good fetal activity is seen
 .Antenatal testing is reassuring. BPP [DATE]. Cephalic
 presentation.
Recommendations

 -BPP and fetal growth next week.
 -Patient can be delivered at 39 weeks gestation provided
 cervix is favorable for induction of labor.
                Ceejay, Paulus N

## 2022-04-01 NOTE — Progress Notes (Signed)
Pt had U/S today was told due to her being Diabetic she may get Induced. Pt also forgot Glucose logs. ? ?Pt also shared that she has a knot under her belly button. ?

## 2022-04-02 ENCOUNTER — Encounter (HOSPITAL_COMMUNITY): Payer: Self-pay | Admitting: *Deleted

## 2022-04-02 ENCOUNTER — Telehealth (HOSPITAL_COMMUNITY): Payer: Self-pay | Admitting: *Deleted

## 2022-04-02 LAB — CBC
Hematocrit: 35.2 % (ref 34.0–46.6)
Hemoglobin: 12.4 g/dL (ref 11.1–15.9)
MCH: 31.7 pg (ref 26.6–33.0)
MCHC: 35.2 g/dL (ref 31.5–35.7)
MCV: 90 fL (ref 79–97)
Platelets: 97 10*3/uL — CL (ref 150–450)
RBC: 3.91 x10E6/uL (ref 3.77–5.28)
RDW: 12.4 % (ref 11.7–15.4)
WBC: 11.5 10*3/uL — ABNORMAL HIGH (ref 3.4–10.8)

## 2022-04-02 NOTE — Telephone Encounter (Signed)
Preadmission screen  

## 2022-04-07 ENCOUNTER — Other Ambulatory Visit: Payer: Self-pay | Admitting: Women's Health

## 2022-04-08 ENCOUNTER — Encounter (HOSPITAL_COMMUNITY): Payer: Self-pay | Admitting: Obstetrics and Gynecology

## 2022-04-08 ENCOUNTER — Ambulatory Visit: Payer: BC Managed Care – PPO

## 2022-04-08 ENCOUNTER — Encounter: Payer: BC Managed Care – PPO | Admitting: Family Medicine

## 2022-04-08 ENCOUNTER — Inpatient Hospital Stay (HOSPITAL_COMMUNITY)
Admission: AD | Admit: 2022-04-08 | Discharge: 2022-04-11 | DRG: 806 | Disposition: A | Discharge status: Home / Self Care | Payer: BC Managed Care – PPO | Attending: Obstetrics & Gynecology | Admitting: Obstetrics & Gynecology

## 2022-04-08 ENCOUNTER — Other Ambulatory Visit: Payer: Self-pay | Admitting: Obstetrics and Gynecology

## 2022-04-08 DIAGNOSIS — O24420 Gestational diabetes mellitus in childbirth, diet controlled: Principal | ICD-10-CM | POA: Diagnosis present

## 2022-04-08 DIAGNOSIS — O9902 Anemia complicating childbirth: Secondary | ICD-10-CM | POA: Diagnosis not present

## 2022-04-08 DIAGNOSIS — D696 Thrombocytopenia, unspecified: Secondary | ICD-10-CM | POA: Diagnosis present

## 2022-04-08 DIAGNOSIS — Z3A39 39 weeks gestation of pregnancy: Secondary | ICD-10-CM | POA: Diagnosis not present

## 2022-04-08 DIAGNOSIS — O134 Gestational [pregnancy-induced] hypertension without significant proteinuria, complicating childbirth: Secondary | ICD-10-CM | POA: Diagnosis present

## 2022-04-08 DIAGNOSIS — O99324 Drug use complicating childbirth: Secondary | ICD-10-CM | POA: Diagnosis present

## 2022-04-08 DIAGNOSIS — D6959 Other secondary thrombocytopenia: Secondary | ICD-10-CM | POA: Diagnosis not present

## 2022-04-08 DIAGNOSIS — F419 Anxiety disorder, unspecified: Secondary | ICD-10-CM | POA: Diagnosis present

## 2022-04-08 DIAGNOSIS — O9832 Other infections with a predominantly sexual mode of transmission complicating childbirth: Secondary | ICD-10-CM | POA: Diagnosis not present

## 2022-04-08 DIAGNOSIS — D649 Anemia, unspecified: Secondary | ICD-10-CM | POA: Diagnosis not present

## 2022-04-08 DIAGNOSIS — F1111 Opioid abuse, in remission: Secondary | ICD-10-CM | POA: Diagnosis present

## 2022-04-08 DIAGNOSIS — O0990 Supervision of high risk pregnancy, unspecified, unspecified trimester: Secondary | ICD-10-CM

## 2022-04-08 DIAGNOSIS — D508 Other iron deficiency anemias: Secondary | ICD-10-CM | POA: Diagnosis present

## 2022-04-08 DIAGNOSIS — Z87891 Personal history of nicotine dependence: Secondary | ICD-10-CM

## 2022-04-08 DIAGNOSIS — O9912 Other diseases of the blood and blood-forming organs and certain disorders involving the immune mechanism complicating childbirth: Secondary | ICD-10-CM | POA: Diagnosis not present

## 2022-04-08 DIAGNOSIS — Z23 Encounter for immunization: Secondary | ICD-10-CM | POA: Diagnosis not present

## 2022-04-08 DIAGNOSIS — Z0542 Observation and evaluation of newborn for suspected metabolic condition ruled out: Secondary | ICD-10-CM | POA: Diagnosis not present

## 2022-04-08 DIAGNOSIS — A6000 Herpesviral infection of urogenital system, unspecified: Secondary | ICD-10-CM | POA: Diagnosis present

## 2022-04-08 DIAGNOSIS — Z3490 Encounter for supervision of normal pregnancy, unspecified, unspecified trimester: Secondary | ICD-10-CM

## 2022-04-08 DIAGNOSIS — O9852 Other viral diseases complicating childbirth: Secondary | ICD-10-CM | POA: Diagnosis not present

## 2022-04-08 DIAGNOSIS — O24419 Gestational diabetes mellitus in pregnancy, unspecified control: Secondary | ICD-10-CM | POA: Diagnosis present

## 2022-04-08 DIAGNOSIS — O24429 Gestational diabetes mellitus in childbirth, unspecified control: Secondary | ICD-10-CM | POA: Diagnosis not present

## 2022-04-08 DIAGNOSIS — Z8619 Personal history of other infectious and parasitic diseases: Secondary | ICD-10-CM | POA: Diagnosis present

## 2022-04-08 LAB — CBC
HCT: 32.8 % — ABNORMAL LOW (ref 36.0–46.0)
Hemoglobin: 11.6 g/dL — ABNORMAL LOW (ref 12.0–15.0)
MCH: 31.7 pg (ref 26.0–34.0)
MCHC: 35.4 g/dL (ref 30.0–36.0)
MCV: 89.6 fL (ref 80.0–100.0)
Platelets: UNDETERMINED 10*3/uL (ref 150–400)
RBC: 3.66 MIL/uL — ABNORMAL LOW (ref 3.87–5.11)
RDW: 12.9 % (ref 11.5–15.5)
WBC: 13.9 10*3/uL — ABNORMAL HIGH (ref 4.0–10.5)
nRBC: 0 % (ref 0.0–0.2)

## 2022-04-08 LAB — TYPE AND SCREEN
ABO/RH(D): O POS
Antibody Screen: NEGATIVE

## 2022-04-08 LAB — GLUCOSE, CAPILLARY
Glucose-Capillary: 112 mg/dL — ABNORMAL HIGH (ref 70–99)
Glucose-Capillary: 71 mg/dL (ref 70–99)

## 2022-04-08 MED ORDER — BUPRENORPHINE HCL-NALOXONE HCL 2-0.5 MG SL SUBL
1.0000 | SUBLINGUAL_TABLET | Freq: Every day | SUBLINGUAL | Status: DC
  Administered 2022-04-09: 1 via SUBLINGUAL
  Filled 2022-04-08: qty 1

## 2022-04-08 MED ORDER — LIDOCAINE HCL (PF) 1 % IJ SOLN: 30.0000 mL | INTRAMUSCULAR | Status: DC | PRN

## 2022-04-08 MED ORDER — LACTATED RINGERS IV SOLN: INTRAVENOUS | Status: DC

## 2022-04-08 MED ORDER — SOD CITRATE-CITRIC ACID 500-334 MG/5ML PO SOLN: 30.0000 mL | ORAL | Status: DC | PRN

## 2022-04-08 MED ORDER — OXYCODONE-ACETAMINOPHEN 5-325 MG PO TABS: 2.0000 | ORAL_TABLET | ORAL | Status: DC | PRN

## 2022-04-08 MED ORDER — LACTATED RINGERS IV SOLN: 500.0000 mL | INTRAVENOUS | Status: DC | PRN

## 2022-04-08 MED ORDER — TERBUTALINE SULFATE 1 MG/ML IJ SOLN: 0.2500 mg | Freq: Once | INTRAMUSCULAR | Status: DC | PRN

## 2022-04-08 MED ORDER — OXYTOCIN-SODIUM CHLORIDE 30-0.9 UT/500ML-% IV SOLN
1.0000 m[IU]/min | INTRAVENOUS | Status: DC
  Administered 2022-04-08: 2 m[IU]/min via INTRAVENOUS
  Filled 2022-04-08: qty 500

## 2022-04-08 MED ORDER — MISOPROSTOL 50MCG HALF TABLET
50.0000 ug | ORAL_TABLET | ORAL | Status: DC | PRN
Start: 2022-04-08 — End: 2022-04-09
  Administered 2022-04-08: 50 ug via BUCCAL
  Filled 2022-04-08: qty 1

## 2022-04-08 MED ORDER — OXYCODONE-ACETAMINOPHEN 5-325 MG PO TABS: 1.0000 | ORAL_TABLET | ORAL | Status: DC | PRN

## 2022-04-08 MED ORDER — OXYTOCIN BOLUS FROM INFUSION
333.0000 mL | Freq: Once | INTRAVENOUS | Status: AC
  Administered 2022-04-09: 333 mL via INTRAVENOUS

## 2022-04-08 MED ORDER — BUPRENORPHINE HCL-NALOXONE HCL 2-0.5 MG SL SUBL: 1.0000 | SUBLINGUAL_TABLET | Freq: Every day | SUBLINGUAL | Status: DC

## 2022-04-08 MED ORDER — ACETAMINOPHEN 325 MG PO TABS
650.0000 mg | ORAL_TABLET | ORAL | Status: DC | PRN
  Administered 2022-04-08 – 2022-04-09 (×2): 650 mg via ORAL
  Filled 2022-04-08 (×2): qty 2

## 2022-04-08 MED ORDER — OXYTOCIN-SODIUM CHLORIDE 30-0.9 UT/500ML-% IV SOLN
2.5000 [IU]/h | INTRAVENOUS | Status: DC
  Administered 2022-04-09 (×2): 2.5 [IU]/h via INTRAVENOUS
  Filled 2022-04-08: qty 500

## 2022-04-08 MED ORDER — ONDANSETRON HCL 4 MG/2ML IJ SOLN: 4.0000 mg | Freq: Four times a day (QID) | INTRAMUSCULAR | Status: DC | PRN

## 2022-04-08 NOTE — H&P (Addendum)
Aimee Guzman is a 36 y.o. female G3P1011 at [redacted]w[redacted]d presenting for IOL for A1GDM and gestational thrombocytopenia. 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  ? ?[place] on 03/10/22 at [redacted]w[redacted]d with normal growth, EFW 59%tile, vertex position, anterior placenta. Platelets 96-low 100s, stable with weekly checks at 36, 37, and 38 weeks.   ? ? ?OB History   ? ? Gravida  ?3  ? Para  ?1  ? Term  ?1  ? Preterm  ?   ? AB  ?1  ? Living  ?1  ?  ? ? SAB  ?1  ? IAB  ?   ? Ectopic  ?   ? Multiple  ?   ? Live Births  ?1  ?   ?  ?  ? ?Past Medical History:  ?Diagnosis Date  ? Gestational diabetes   ? Gestational thrombocytopenia (HCC)   ? HSV (herpes simplex virus) infection   ? Opioid use disorder   ? Vaginal Pap smear, abnormal   ? ?Past Surgical History:  ?Procedure Laterality Date  ? EYE SURGERY    ? LEEP    ? WRIST SURGERY    ? ?Family History: family history includes Cancer in her maternal grandmother and mother; Heart disease in her maternal grandfather. ?Social History:  reports that she quit smoking about 2 years ago. Her smoking use included cigarettes. She has never used smokeless tobacco. She reports current drug use. Drug: Marijuana. She reports that she does not drink alcohol. ? ? ?  ?Maternal Diabetes: Yes:  Diabetes Type:  Diet controlled ?Genetic Screening: Normal ?Maternal Ultrasounds/Referrals: Normal ?Fetal Ultrasounds or other Referrals:  None ?Maternal Substance Abuse:  Yes:  Type: Other:  ?Significant Maternal Medications:  Meds include: Other:  ?Significant Maternal Lab Results:  Group B Strep negative ?Other Comments:    suboxone, THC ? ?Review of Systems  ?Constitutional:  Negative for chills, fatigue and fever.  ?Eyes:  Negative for visual disturbance.  ?Respiratory:  Negative for shortness of breath.   ?Cardiovascular:  Negative for chest pain.  ?Gastrointestinal:  Negative for abdominal pain and vomiting.   ?Genitourinary:  Negative for difficulty urinating, dysuria, flank pain, pelvic pain, vaginal bleeding, vaginal discharge and vaginal pain.  ?Neurological:  Negative for dizziness and headaches.  ?Psychiatric/Behavioral: Negative.    ?Maternal Medical History:  ?Reason for admission: IOL for GDM and thrombocytopenia ? ?Contractions: Frequency: irregular.   ?Perceived severity is mild.   ?Fetal activity: Perceived fetal activity is normal.   ?Prenatal complications: Substance abuse.   ?Prenatal Complications - Diabetes: gestational. ?Diabetes is managed by diet.   ? ?Dilation: 1.5 ?Effacement (%): 50 ?Station: -3 ?Exam by:: Mary [place] Johnson, RNC-OB ?Blood pressure 140/89, pulse 87, resp. rate 15, height 5\' 6"  (1.676 m), weight 84.4 kg, last menstrual period 06/26/2021, unknown if currently breastfeeding. ?Maternal Exam:  ?Uterine Assessment: Contraction strength is mild.  Contraction frequency is irregular.  ?Abdomen: Fetal presentation: vertex ?Cervix: Cervix evaluated by digital exam.   ? ? ?Fetal Exam ?Fetal Monitor Review: Mode: ultrasound.   ?Baseline rate: 130.  ?Variability: moderate (6-25 bpm).   ?Pattern: accelerations present and no decelerations.   ?Fetal State Assessment: Category I - tracings are normal. ? ?Physical Exam ?Vitals and nursing note reviewed.  ?Constitutional:   ?   Appearance: She is well-developed.  ?Cardiovascular:  ?   Rate and Rhythm: Normal rate and regular rhythm.  ?  Heart sounds: Normal heart sounds.  ?Pulmonary:  ?   Effort: Pulmonary effort is normal.  ?Abdominal:  ?   Palpations: Abdomen is soft.  ?Genitourinary: ?   Comments: No visible lesions on external exam, SSE not performed ?Musculoskeletal:     ?   General: Normal range of motion.  ?   Cervical back: Normal range of motion.  ?Skin: ?   General: Skin is warm and dry.  ?Neurological:  ?   Mental Status: She is alert and oriented to person, place, and time.  ?Psychiatric:     ?   Behavior: Behavior normal.     ?    Thought Content: Thought content normal.     ?   Judgment: Judgment normal.  ?  ?Prenatal labs: ?ABO, Rh: --/--/PENDING (05/10 1648) ?Antibody: PENDING (05/10 1648) ?Rubella:   ?RPR: Non Reactive (03/03 0830)  ?HBsAg:    ?HIV: Non Reactive (03/03 0830)  ?GBS: Negative/-- (04/19 1621)  ? ?Assessment/Plan: ?36 y.o.  G3P1011  at [redacted]w[redacted]d  ?A1GDM ?Gestational thrombocytopenia ?Substance use disorder on suboxone ?GBS neg ? ?Admit to L&D ?IV start by IV team ?Cytotec for cervical ripening, foley bulb when appropriate ?Anticipate NSVD  ? ?Misty Stanley Leftwich-Kirby ?04/08/2022, 5:34 PM ? ? ? ? ?

## 2022-04-08 NOTE — Progress Notes (Signed)
Labor Progress Note ?Aimee Guzman is a 36 y.o. G3P1011 at [redacted]w[redacted]d presented for IOL due to A1GDM.  ? ?S: doing well.  ? ?O:  ?BP (!) 141/80   Pulse 83   Resp 16   Ht 5\' 6"  (1.676 m)   Wt 84.4 kg   LMP 06/26/2021   BMI 30.02 kg/m?  ?EFM: 140/mod/15x15/none ? ?CVE: Dilation: 3 ?Effacement (%): 80 ?Cervical Position: Anterior ?Station: -3 ?Presentation: Vertex ?Exam by:: Dr. [email or web address] ? ? ?A&P: 36 y.o. G3P1011 [redacted]w[redacted]d  ?#Labor: Progressing well s/p one cytotec. Transition to pit 2x2 and AROM when station improves.  ?#Pain: PRN, planning for epidural  ?#FWB: Cat I  ?#GBS negative ? ?#A1GDM: CBG WNL. Cont to monitor.  ? ?#Elevated blood pressure without diagnosis: Will monitor. Collect baseline pre-e labs. No symptoms.  ? ?#H/o HSV: speculum exam performed without lesions seen.  ? ?[redacted]w[redacted]d, DO ?10:37 PM  ?

## 2022-04-09 ENCOUNTER — Inpatient Hospital Stay (HOSPITAL_COMMUNITY): Payer: BC Managed Care – PPO

## 2022-04-09 ENCOUNTER — Inpatient Hospital Stay (HOSPITAL_COMMUNITY): Payer: BC Managed Care – PPO | Admitting: Anesthesiology

## 2022-04-09 ENCOUNTER — Encounter (HOSPITAL_COMMUNITY): Payer: Self-pay | Admitting: Obstetrics and Gynecology

## 2022-04-09 DIAGNOSIS — Z3A39 39 weeks gestation of pregnancy: Secondary | ICD-10-CM

## 2022-04-09 DIAGNOSIS — O24420 Gestational diabetes mellitus in childbirth, diet controlled: Secondary | ICD-10-CM

## 2022-04-09 DIAGNOSIS — O9912 Other diseases of the blood and blood-forming organs and certain disorders involving the immune mechanism complicating childbirth: Secondary | ICD-10-CM

## 2022-04-09 DIAGNOSIS — O9852 Other viral diseases complicating childbirth: Secondary | ICD-10-CM

## 2022-04-09 LAB — CBC
HCT: 31.2 % — ABNORMAL LOW (ref 36.0–46.0)
HCT: 31.4 % — ABNORMAL LOW (ref 36.0–46.0)
Hemoglobin: 11.2 g/dL — ABNORMAL LOW (ref 12.0–15.0)
Hemoglobin: 11.2 g/dL — ABNORMAL LOW (ref 12.0–15.0)
MCH: 32 pg (ref 26.0–34.0)
MCH: 32.2 pg (ref 26.0–34.0)
MCHC: 35.9 g/dL (ref 30.0–36.0)
MCHC: 35.7 g/dL (ref 30.0–36.0)
MCV: 89.1 fL (ref 80.0–100.0)
MCV: 90.2 fL (ref 80.0–100.0)
Platelets: 94 10*3/uL — ABNORMAL LOW (ref 150–400)
Platelets: 96 10*3/uL — ABNORMAL LOW (ref 150–400)
RBC: 3.5 MIL/uL — ABNORMAL LOW (ref 3.87–5.11)
RBC: 3.48 MIL/uL — ABNORMAL LOW (ref 3.87–5.11)
RDW: 12.9 % (ref 11.5–15.5)
RDW: 12.9 % (ref 11.5–15.5)
WBC: 11.7 10*3/uL — ABNORMAL HIGH (ref 4.0–10.5)
WBC: 12 10*3/uL — ABNORMAL HIGH (ref 4.0–10.5)
nRBC: 0 % (ref 0.0–0.2)
nRBC: 0 % (ref 0.0–0.2)

## 2022-04-09 LAB — PROTEIN / CREATININE RATIO, URINE
Creatinine, Urine: 40.51 mg/dL
Protein Creatinine Ratio: 0.27 mg/mg{Cre} — ABNORMAL HIGH (ref 0.00–0.15)
Total Protein, Urine: 11 mg/dL

## 2022-04-09 LAB — COMPREHENSIVE METABOLIC PANEL
ALT: 14 U/L (ref 0–44)
AST: 19 U/L (ref 15–41)
Albumin: 2.8 g/dL — ABNORMAL LOW (ref 3.5–5.0)
Alkaline Phosphatase: 206 U/L — ABNORMAL HIGH (ref 38–126)
Anion gap: 9 (ref 5–15)
BUN: 14 mg/dL (ref 6–20)
CO2: 25 mmol/L (ref 22–32)
Calcium: 9.7 mg/dL (ref 8.9–10.3)
Chloride: 102 mmol/L (ref 98–111)
Creatinine, Ser: 0.65 mg/dL (ref 0.44–1.00)
GFR, Estimated: >60 mL/min (ref >60)
Glucose, Bld: 69 mg/dL — ABNORMAL LOW (ref 70–99)
Potassium: 3.9 mmol/L (ref 3.5–5.1)
Sodium: 136 mmol/L (ref 135–145)
Total Bilirubin: 0.9 mg/dL (ref 0.3–1.2)
Total Protein: 5.9 g/dL — ABNORMAL LOW (ref 6.5–8.1)

## 2022-04-09 LAB — GLUCOSE, CAPILLARY
Glucose-Capillary: 70 mg/dL (ref 70–99)
Glucose-Capillary: 87 mg/dL (ref 70–99)
Glucose-Capillary: 83 mg/dL (ref 70–99)

## 2022-04-09 LAB — RPR: RPR Ser Ql: NONREACTIVE

## 2022-04-09 MED ORDER — EPHEDRINE 5 MG/ML INJ: 10.0000 mg | INTRAVENOUS | Status: DC | PRN

## 2022-04-09 MED ORDER — TETANUS-DIPHTH-ACELL PERTUSSIS 5-2.5-18.5 LF-MCG/0.5 IM SUSY: 0.5000 mL | PREFILLED_SYRINGE | Freq: Once | INTRAMUSCULAR | Status: DC

## 2022-04-09 MED ORDER — WITCH HAZEL-GLYCERIN EX PADS
1.0000 | MEDICATED_PAD | CUTANEOUS | Status: DC | PRN
Start: 2022-04-09 — End: 2022-04-12

## 2022-04-09 MED ORDER — LIDOCAINE HCL (PF) 1 % IJ SOLN
INTRAMUSCULAR | Status: DC | PRN
  Administered 2022-04-09 (×2): 5 mL via EPIDURAL

## 2022-04-09 MED ORDER — SIMETHICONE 80 MG PO CHEW: 80.0000 mg | CHEWABLE_TABLET | ORAL | Status: DC | PRN

## 2022-04-09 MED ORDER — COCONUT OIL OIL
1.0000 | TOPICAL_OIL | Status: DC | PRN
Start: 2022-04-09 — End: 2022-04-12

## 2022-04-09 MED ORDER — ZOLPIDEM TARTRATE 5 MG PO TABS: 5.0000 mg | ORAL_TABLET | Freq: Every evening | ORAL | Status: DC | PRN

## 2022-04-09 MED ORDER — PHENYLEPHRINE 80 MCG/ML (10ML) SYRINGE FOR IV PUSH (FOR BLOOD PRESSURE SUPPORT): 80.0000 ug | PREFILLED_SYRINGE | INTRAVENOUS | Status: DC | PRN

## 2022-04-09 MED ORDER — DIBUCAINE (PERIANAL) 1 % EX OINT: 1.0000 "application " | TOPICAL_OINTMENT | CUTANEOUS | Status: DC | PRN

## 2022-04-09 MED ORDER — DIPHENHYDRAMINE HCL 50 MG/ML IJ SOLN: 12.5000 mg | INTRAMUSCULAR | Status: DC | PRN

## 2022-04-09 MED ORDER — DIPHENHYDRAMINE HCL 25 MG PO CAPS: 25.0000 mg | ORAL_CAPSULE | Freq: Four times a day (QID) | ORAL | Status: DC | PRN

## 2022-04-09 MED ORDER — MISOPROSTOL 200 MCG PO TABS
ORAL_TABLET | ORAL | Status: AC
  Filled 2022-04-09: qty 5

## 2022-04-09 MED ORDER — ACETAMINOPHEN 325 MG PO TABS
650.0000 mg | ORAL_TABLET | ORAL | Status: DC | PRN
  Administered 2022-04-09 – 2022-04-10 (×2): 650 mg via ORAL
  Filled 2022-04-09 (×2): qty 2

## 2022-04-09 MED ORDER — TRANEXAMIC ACID-NACL 1000-0.7 MG/100ML-% IV SOLN: 1000.0000 mg | Freq: Once | INTRAVENOUS | Status: AC

## 2022-04-09 MED ORDER — SENNOSIDES-DOCUSATE SODIUM 8.6-50 MG PO TABS
2.0000 | ORAL_TABLET | Freq: Every day | ORAL | Status: DC
  Administered 2022-04-10 – 2022-04-11 (×2): 2 via ORAL
  Filled 2022-04-09 (×2): qty 2

## 2022-04-09 MED ORDER — MISOPROSTOL 200 MCG PO TABS
1000.0000 ug | ORAL_TABLET | Freq: Once | ORAL | Status: AC | PRN
  Administered 2022-04-09: 1000 ug via RECTAL

## 2022-04-09 MED ORDER — PRENATAL MULTIVITAMIN CH
1.0000 | ORAL_TABLET | Freq: Every day | ORAL | Status: DC
  Administered 2022-04-09 – 2022-04-11 (×3): 1 via ORAL
  Filled 2022-04-09 (×3): qty 1

## 2022-04-09 MED ORDER — TRANEXAMIC ACID-NACL 1000-0.7 MG/100ML-% IV SOLN
INTRAVENOUS | Status: AC
  Administered 2022-04-09: 1000 mg via INTRAVENOUS
  Filled 2022-04-09: qty 100

## 2022-04-09 MED ORDER — ONDANSETRON HCL 4 MG PO TABS: 4.0000 mg | ORAL_TABLET | ORAL | Status: DC | PRN

## 2022-04-09 MED ORDER — NIFEDIPINE ER OSMOTIC RELEASE 30 MG PO TB24
30.0000 mg | ORAL_TABLET | Freq: Every day | ORAL | Status: DC
Start: 2022-04-09 — End: 2022-04-12
  Administered 2022-04-11: 30 mg via ORAL
  Filled 2022-04-09 (×3): qty 1

## 2022-04-09 MED ORDER — IBUPROFEN 600 MG PO TABS
600.0000 mg | ORAL_TABLET | Freq: Four times a day (QID) | ORAL | Status: DC
  Administered 2022-04-09 – 2022-04-11 (×9): 600 mg via ORAL
  Filled 2022-04-09 (×9): qty 1

## 2022-04-09 MED ORDER — ONDANSETRON HCL 4 MG/2ML IJ SOLN
4.0000 mg | INTRAMUSCULAR | Status: DC | PRN
Start: 2022-04-09 — End: 2022-04-12

## 2022-04-09 MED ORDER — SODIUM CHLORIDE 0.9 % IV SOLN
3.0000 g | Freq: Once | INTRAVENOUS | Status: AC | PRN
  Administered 2022-04-09: 3 g via INTRAVENOUS
  Filled 2022-04-09: qty 8

## 2022-04-09 MED ORDER — BENZOCAINE-MENTHOL 20-0.5 % EX AERO
1.0000 "application " | INHALATION_SPRAY | CUTANEOUS | Status: DC | PRN
  Administered 2022-04-09: 1 via TOPICAL
  Filled 2022-04-09: qty 56

## 2022-04-09 MED ORDER — LACTATED RINGERS IV SOLN: 500.0000 mL | Freq: Once | INTRAVENOUS | Status: DC

## 2022-04-09 MED ORDER — TRANEXAMIC ACID-NACL 1000-0.7 MG/100ML-% IV SOLN: 1000.0000 mg | INTRAVENOUS | Status: DC

## 2022-04-09 MED ORDER — FENTANYL-BUPIVACAINE-NACL 0.5-0.125-0.9 MG/250ML-% EP SOLN
12.0000 mL/h | EPIDURAL | Status: DC | PRN
  Administered 2022-04-09: 12 mL/h via EPIDURAL
  Filled 2022-04-09: qty 250

## 2022-04-09 NOTE — Anesthesia Preprocedure Evaluation (Signed)
Anesthesia Evaluation  ?Patient identified by MRN, date of birth, ID band ?Patient awake ? ? ? ?Reviewed: ?Allergy & Precautions, Patient's Chart, lab work & pertinent test results ? ?Airway ?Mallampati: II ? ?TM Distance: >3 FB ?Neck ROM: Full ? ? ? Dental ?no notable dental hx. ?(+) Teeth Intact ?  ?Pulmonary ?former smoker,  ?  ?Pulmonary exam normal ?breath sounds clear to auscultation ? ? ? ? ? ? Cardiovascular ?negative cardio ROS ?Normal cardiovascular exam ?Rhythm:Regular Rate:Normal ? ? ?  ?Neuro/Psych ?Seizures -, Well Controlled,  PSYCHIATRIC DISORDERS Anxiety   ? GI/Hepatic ?negative GI ROS, (+)  ?  ? substance abuse ? , Hepatitis -, CHx/o opioid use disorder on Suboxone ?  ?Endo/Other  ?diabetes, Well Controlled, Gestational ? Renal/GU ?  ?negative genitourinary ?  ?Musculoskeletal ? ?(+) narcotic dependent ? Abdominal ?(+) + obese,   ?Peds ? Hematology ? ?(+) Blood dyscrasia, anemia , Gestational thrombocytopenia   ?Anesthesia Other Findings ? ? Reproductive/Obstetrics ?(+) Pregnancy ?HSV ? ?  ? ? ? ? ? ? ? ? ? ? ? ? ? ?  ?  ? ? ? ? ? ? ? ? ?Anesthesia Physical ?Anesthesia Plan ? ?ASA: 2 ? ?Anesthesia Plan: Epidural  ? ?Post-op Pain Management:   ? ?Induction:  ? ?PONV Risk Score and Plan:  ? ?Airway Management Planned: Natural Airway ? ?Additional Equipment:  ? ?Intra-op Plan:  ? ?Post-operative Plan:  ? ?Informed Consent: I have reviewed the patients History and Physical, chart, labs and discussed the procedure including the risks, benefits and alternatives for the proposed anesthesia with the patient or authorized representative who has indicated his/her understanding and acceptance.  ? ? ? ? ? ?Plan Discussed with: Anesthesiologist ? ?Anesthesia Plan Comments:   ? ? ? ? ? ? ?Anesthesia Quick Evaluation ? ?

## 2022-04-09 NOTE — Anesthesia Postprocedure Evaluation (Signed)
Anesthesia Post Note ? ?Patient: Aimee Guzman ? ?Procedure(s) Performed: AN AD HOC LABOR EPIDURAL ? ?  ? ?Patient location during evaluation: Mother Baby ?Anesthesia Type: Epidural ?Level of consciousness: awake ?Pain management: satisfactory to patient ?Vital Signs Assessment: post-procedure vital signs reviewed and stable ?Respiratory status: spontaneous breathing ?Cardiovascular status: stable ?Anesthetic complications: no ? ? ?No notable events documented. ? ?Last Vitals:  ?Vitals:  ? 04/09/22 1235 04/09/22 1303  ?BP:  121/72  ?Pulse:  (!) 104  ?Resp:    ?Temp:    ?SpO2: 99%   ?  ?Last Pain:  ?Vitals:  ? 04/09/22 1317  ?TempSrc:   ?PainSc: 0-No pain  ? ?Pain Goal: Patients Stated Pain Goal: 2 (04/08/22 2351) ? ?  ?  ?  ?  ?  ?  ?  ? ?Claudis Giovanelli ? ? ? ? ?

## 2022-04-09 NOTE — Progress Notes (Signed)
Patient ID: Aimee Guzman, female   DOB: July 15, 1986, 36 y.o.   MRN: [id number] ? ?CNM called to bedside for increased bleeding and boggy uterus.  ? ?Moderate bleeding noted on pad upon arrival. Vital signs stable.  Blind sweep x2 performed with patient permission. Pt tolerated well. Pain management offered. Pt declined. Boggy LUS noted and large clot x1 with smaller clots trailing behind expelled. LUS massaged. Drenda Freeze CNM requested to bedside for evaluation. Fundal massage and 1 additional uterine sweep performed by S. [name] CNM. No clots. Scant bleeding and uterus firm.  ? ?Vital signs stable.  ?Abx ordered. ? rectal cytotec ordered.   ?Continue to watch for bleeding and signs of infection.  ? ?Jordan Caraveo Danella Deis) [name], MSN, CNM  ?Center for [facility]  ?04/09/22 2:55 PM  ? ?

## 2022-04-09 NOTE — Progress Notes (Signed)
Aimee Guzman is a 36 y.o. G3P1011 at [redacted]w[redacted]d by ultrasound admitted for induction of labor due to A1DM gest thrombocytopenia, hx of Opioid use disorder on suboxone. Hx of epilepsy 10 years ago, not on medication, HSV on Valtrex without symptoms or outbreak. Hx of precipitous 37w delivery in 2020.  ? ?Subjective: ?Aimee Guzman is well rested and comfortable with her epidural. She is excited to meet baby girl "Aimee Guzman." FOB at bedside and is supportive.  ? ?Objective: ?BP 122/80 (BP Location: Right Arm)   Pulse 83   Temp 98.5 ?F (36.9 ?C) (Oral)   Resp 18   Ht 5\' 6"  (1.676 m)   Wt 84.4 kg   LMP 06/26/2021   SpO2 99%   BMI 30.02 kg/m?  ?No intake/output data recorded. ?Total I/O ?In: -  ?Out: 500 [Urine:500] ? ?FHT:  FHR: 135 bpm, variability: moderate,  accelerations:  Present,  decelerations:  Absent ?UC:   difficult to trace; RN adjusting for better tracing.  ? ?SVE:   Dilation: 4 ?Effacement (%): 80 ?Station: -1 ?Exam by:: [email or web address], CNM ? ?Labs: ?Lab Results  ?Component Value Date  ? WBC 11.7 (H) 04/09/2022  ? HGB 11.2 (L) 04/09/2022  ? HCT 31.2 (L) 04/09/2022  ? MCV 89.1 04/09/2022  ? PLT 94 (L) 04/09/2022  ? ?Results for orders placed or performed during the hospital encounter of 04/08/22 (from the past 24 hour(s))  ?CBC     Status: Abnormal  ? Collection Time: 04/08/22  3:36 PM  ?Result Value Ref Range  ? WBC 13.9 (H) 4.0 - 10.5 K/uL  ? RBC 3.66 (L) 3.87 - 5.11 MIL/uL  ? Hemoglobin 11.6 (L) 12.0 - 15.0 g/dL  ? HCT 32.8 (L) 36.0 - 46.0 %  ? MCV 89.6 80.0 - 100.0 fL  ? MCH 31.7 26.0 - 34.0 pg  ? MCHC 35.4 30.0 - 36.0 g/dL  ? RDW 12.9 11.5 - 15.5 %  ? Platelets PLATELET CLUMPS NOTED ON SMEAR, UNABLE TO ESTIMATE 150 - 400 K/uL  ? nRBC 0.0 0.0 - 0.2 %  ?RPR     Status: None  ? Collection Time: 04/08/22  3:36 PM  ?Result Value Ref Range  ? RPR Ser Ql NON REACTIVE NON REACTIVE  ?Glucose, capillary     Status: None  ? Collection Time: 04/08/22  4:06 PM  ?Result Value Ref Range  ? Glucose-Capillary 71 70 - 99 mg/dL   ?Type and screen     Status: None  ? Collection Time: 04/08/22  4:48 PM  ?Result Value Ref Range  ? ABO/RH(D) O POS   ? Antibody Screen NEG   ? Sample Expiration    ?  04/11/2022,2359 ?Performed at [facility], [address], [place], [place] ?  ?Comprehensive metabolic panel     Status: Abnormal  ? Collection Time: 04/08/22  4:48 PM  ?Result Value Ref Range  ? Sodium 136 135 - 145 mmol/L  ? Potassium 3.9 3.5 - 5.1 mmol/L  ? Chloride 102 98 - 111 mmol/L  ? CO2 25 22 - 32 mmol/L  ? Glucose, Bld 69 (L) 70 - 99 mg/dL  ? BUN 14 6 - 20 mg/dL  ? Creatinine, Ser 0.65 0.44 - 1.00 mg/dL  ? Calcium 9.7 8.9 - 10.3 mg/dL  ? Total Protein 5.9 (L) 6.5 - 8.1 g/dL  ? Albumin 2.8 (L) 3.5 - 5.0 g/dL  ? AST 19 15 - 41 U/L  ? ALT 14 0 - 44 U/L  ?  Alkaline Phosphatase 206 (H) 38 - 126 U/L  ? Total Bilirubin 0.9 0.3 - 1.2 mg/dL  ? GFR, Estimated >60 >60 mL/min  ? Anion gap 9 5 - 15  ?Glucose, capillary     Status: Abnormal  ? Collection Time: 04/08/22 11:53 PM  ?Result Value Ref Range  ? Glucose-Capillary 112 (H) 70 - 99 mg/dL  ?Protein / creatinine ratio, urine     Status: Abnormal  ? Collection Time: 04/09/22 12:46 AM  ?Result Value Ref Range  ? Creatinine, Urine 40.51 mg/dL  ? Total Protein, Urine 11 mg/dL  ? Protein Creatinine Ratio 0.27 (H) 0.00 - 0.15 mg/mg[Cre]  ?CBC     Status: Abnormal  ? Collection Time: 04/09/22  3:57 AM  ?Result Value Ref Range  ? WBC 11.7 (H) 4.0 - 10.5 K/uL  ? RBC 3.50 (L) 3.87 - 5.11 MIL/uL  ? Hemoglobin 11.2 (L) 12.0 - 15.0 g/dL  ? HCT 31.2 (L) 36.0 - 46.0 %  ? MCV 89.1 80.0 - 100.0 fL  ? MCH 32.0 26.0 - 34.0 pg  ? MCHC 35.9 30.0 - 36.0 g/dL  ? RDW 12.9 11.5 - 15.5 %  ? Platelets 94 (L) 150 - 400 K/uL  ? nRBC 0.0 0.0 - 0.2 %  ?Glucose, capillary     Status: None  ? Collection Time: 04/09/22  5:44 AM  ?Result Value Ref Range  ? Glucose-Capillary 70 70 - 99 mg/dL  ?Glucose, capillary     Status: None  ? Collection Time: 04/09/22  9:10 AM  ?Result Value Ref Range  ? Glucose-Capillary 87 70  - 99 mg/dL  ? ?Procedure: CNM discussed option of AROM for continued labor progression. R/B discussed with the patient. Through shared decision making pt and family agreed. FHT: Category I with positive scalp stim. SVE revealed 4/80/-1 fetal head well applied to cervix. CNM, AROM of membranes @0902  with moderate amount of clear fluid noted. FHT remained category I.   ? ? ?Assessment / Plan: ?36 y.o. G3P1011 at [redacted]w[redacted]d Induction of labor due to gestational diabetes and Gestational thrombocytopenia, opioid use disorder on suboxone, on 12 miliunits of pitocin.  ? ?Labor:  AROM clear fluid. Continue to titrate Pitocin as tolerated.  ?A1DM- Diet controlled; Stable. Q4 CBG.  ?Preeclampsia:   gHTN. No severe range BPs. Protein creatine ratio 0.27. Continue to monitor for signs of PreE.  ?Fetal Wellbeing:  Category I. Monitor for signs of distress.  ?Pain Control:  Epidural ?I/D:   HSV on Valtrex prophylactic. GBS negative.  ?Anticipated MOD:  NSVD ? ?[redacted]w[redacted]d, MSN, CNM  ?04/09/2022, 9:37 AM ? ? ?

## 2022-04-09 NOTE — Anesthesia Procedure Notes (Addendum)
Epidural ?Patient location during procedure: OB ?Start time: 04/09/2022 5:10 AM ?End time: 04/09/2022 5:19 AM ? ?Staffing ?Anesthesiologist: Mal Amabile, MD ?Performed: anesthesiologist  ? ?Preanesthetic Checklist ?Completed: patient identified, IV checked, site marked, risks and benefits discussed, surgical consent, monitors and equipment checked, pre-op evaluation and timeout performed ? ?Epidural ?Patient position: sitting ?Prep: DuraPrep and site prepped and draped ?Patient monitoring: continuous pulse ox and blood pressure ?Approach: midline ?Injection technique: LOR air ? ?Needle:  ?Needle type: Tuohy  ?Needle gauge: 17 G ?Needle length: 9 cm and 9 ?Needle insertion depth: 5 cm cm ?Catheter type: closed end flexible ?Catheter size: 19 Gauge ?Catheter at skin depth: 10 cm ?Test dose: negative and Other ? ?Assessment ?Events: blood not aspirated, injection not painful, no injection resistance, no paresthesia and negative IV test ? ?Additional Notes ?Patient identified. Risks and benefits discussed including failed block, incomplete  ?Pain control, post dural puncture headache, nerve damage, paralysis, blood pressure ?Changes, nausea, vomiting, reactions to medications-both toxic and allergic and post ?Partum back pain. All questions were answered. Patient expressed understanding and wished to proceed. Sterile technique was used throughout procedure. Epidural site was ?Dressed with sterile barrier dressing. No paresthesias, signs of intravascular injection ?Or signs of intrathecal spread were encountered.  ?Patient was more comfortable after the epidural was dosed. ?Please see RN's note for documentation of vital signs and FHR which are stable. ? ?Reason for block:procedure for pain ? ? ? ?

## 2022-04-09 NOTE — Lactation Note (Signed)
This note was copied from a baby's chart. ?Lactation Consultation Note ? ?Patient Name: Aimee Guzman ?Today's Date: 04/09/2022 ?Reason for consult: Initial assessment;Term;Breastfeeding assistance (hypoglycemia) ?Age:35 hours ? ?P2, Term, Infant Female ? ?LC to patient room due to hypoglycemia. RN called to lactation because baby's blood sugar was a 25. Mom fed baby and blood sugar went up to 40. LC spoke to mom about the importance of latching baby to help with blood sugar. LC also talked to mom about hand expressing and feeding baby expressed milk via a spoon. Mom says that she would rather pump. Mom states that she would like to alternate pumping and latching baby.  ? ?LC set mom up with a DEBP and showed her how to use the pump. LC made mom a hands-free pumping bra and helped mom to get started with pumping. Mom used size 24 flanges. LC spoke with mom about washing pump parts and milk storage.  ? ?Mom states that she has no further questions.  ? ?LC let mom know that she could call lactation for further assistance if needed.  ? ?Maternal Data ?  ? ?Feeding ?  ? ?LATCH Score ?Latch: Repeated attempts needed to sustain latch, nipple held in mouth throughout feeding, stimulation needed to elicit sucking reflex. ? ?Audible Swallowing: Spontaneous and intermittent ? ?Type of Nipple: Everted at rest and after stimulation ? ?Comfort (Breast/Nipple): Soft / non-tender ? ?Hold (Positioning): Full assist, staff holds infant at breast ? ?LATCH Score: 7 ? ? ?Lactation Tools Discussed/Used ?Tools: Hands-free pumping top;Pump;Flanges ?Flange Size: 24 ?Breast pump type: Double-Electric Breast Pump ?Pump Education: Setup, frequency, and cleaning;Milk Storage ?Reason for Pumping: Baby has had blood sugar issues and mom wants to pump for added stimulation. ? ?Interventions ?Interventions: Breast feeding basics reviewed;Education;DEBP ? ?Discharge ?  ? ?Consult Status ?Consult Status: Follow-up ?Date: 04/10/22 ? ? ? ?Ed Mandich P  Delonta Yohannes ?04/09/2022, 6:57 PM ? ? ? ?

## 2022-04-09 NOTE — Progress Notes (Signed)
Labor Progress Note ?Aimee Guzman is a 36 y.o. G3P1011 at [redacted]w[redacted]d presented for IOL due to A1GDM.  ? ?S: Doing well, rating ctx around 4/10.  ? ?O:  ?BP 130/76   Pulse 94   Temp 97.7 ?F (36.5 ?C) (Axillary)   Resp 16   Ht 5\' 6"  (1.676 m)   Wt 84.4 kg   LMP 06/26/2021   BMI 30.02 kg/m?  ?EFM: 125/mod/15x15/none ? ?CVE: Dilation: 4 ?Effacement (%): 80 ?Cervical Position: Anterior ?Station: -3 ?Presentation: Vertex ?Exam by:: Dr. Higinio Plan ? ? ?A&P: 36 y.o. G3P1011 [redacted]w[redacted]d  ?#Labor: Some progression since last check. Head well applied and appropriate for AROM. Patient would like to get an epidural first. Known history of gestational hypertension with plts around 95-100. Plan to repeat platelet level and then hopefully epidural.  ?#Pain: PRN> planning epidural  ?#FWB: Cat I currently  ?#GBS negative ? ? ?[name], DO ? ?

## 2022-04-09 NOTE — Lactation Note (Signed)
This note was copied from a baby's chart. ?Lactation Consultation Note ? ?Patient Name: Aimee Guzman ?Today's Date: 04/09/2022 ?  ?Age:35 hours ? ?LC entered the room. Mom holding baby. LC spoke with mom about helping her get baby latched and possibly starting with a DEBP to help with baby's blood sugar. Mom stated that she was fine with both, but would like to eat first. LC told mom that I would come back to see her in about 30-45 min.  ? ?Maternal Data ?  ? ?Feeding ?  ? ?LATCH Score ?Latch: Repeated attempts needed to sustain latch, nipple held in mouth throughout feeding, stimulation needed to elicit sucking reflex. ? ?Audible Swallowing: Spontaneous and intermittent ? ?Type of Nipple: Everted at rest and after stimulation ? ?Comfort (Breast/Nipple): Soft / non-tender ? ?Hold (Positioning): Full assist, staff holds infant at breast ? ?LATCH Score: 7 ? ? ?Lactation Tools Discussed/Used ?  ? ?Interventions ?Interventions: Breast feeding basics reviewed;Assisted with latch;Skin to skin;Adjust position;Support pillows;Hand express;Education ? ?Discharge ?  ? ?Consult Status ?  ? ? ? ?Jamyrah Saur P Asaad Gulley ?04/09/2022, 5:09 PM ? ? ? ?

## 2022-04-09 NOTE — Discharge Summary (Signed)
? Postpartum Discharge Summary ? ?Date of Service updated ? ?   ?Patient Name: Aimee Guzman ?DOB: [birth year] ?MRN: [id number] ? ?Date of admission: 04/08/2022 ?Delivery date:04/09/2022  ?Delivering provider: Renard Matter  ?Date of discharge: 04/11/2022 ? ?Admitting diagnosis: Pregnancy [Z34.90] ?Intrauterine pregnancy: [redacted]w[redacted]d    ?Secondary diagnosis:  Principal Problem: ?  Pregnancy ?Active Problems: ?  Opioid use disorder, mild, in sustained remission ([place] ?  Iron deficiency anemia secondary to inadequate dietary iron intake ?  Gestational thrombocytopenia ([place] ?  Anxiety ?  Gestational diabetes mellitus (GDM) ?  History of hepatitis C ? 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 yo G3P1011 at 369w1das admitted to the hospital 04/08/2022 for induction of labor.  Indication for induction: Gestational thrombocytopenia, A1 DM, and OUD on Saboxone.  Patient had an uncomplicated labor course as follows: ?Membrane Rupture Time/Date: 9:02 AM ,04/09/2022   ?Delivery Method:Vaginal, Vacuum (Extractor)  ?Episiotomy: None  ?Lacerations:  2nd degree;Periurethral  ?Details of delivery can be found in separate delivery note.  Patient had a routine postpartum course. Patient is discharged home 04/11/22. ? ?Newborn Data: ?Birth date:04/09/2022  ?Birth time:11:02 AM  ?Gender:Female  ?Living status:Living  ?Apgars:8 ,9  ?Weight:3380 g  ? ?Magnesium Sulfate received: No ?BMZ received: No ?Rhophylac:No ?MMR:No- Immune  ?T-DaP:Given prenatally ?Flu: Yes 08/18/21 ?Transfusion:No ? ?Physical exam  ?Vitals:  ? 04/10/22 0500 04/10/22 1522 04/10/22 2152 04/11/22 0639  ?BP: 118/76  125/77 113/66 94/68  ?Pulse: 98  80 72  ?Resp: _0 ?Temp: 98.7 ?F (37.1 ?C) (!) 97.4 ?F (36.3 ?C) 98 ?F (36.7 ?C) 98 ?F (36.7 ?C)  ?TempSrc: Oral Oral Oral Oral  ?SpO2: 98% 99% 99%   ?Weight:      ?Height:      ? ?General: alert, cooperative, and no distress ?Lochia: appropriate ?Uterine Fundus: firm ?Incision: N/A ?DVT Evaluation: No significant calf/ankle edema. ?Labs: ?Lab Results  ?Component Value Date  ? WBC 12.2 (H) 04/10/2022  ? HGB 8.8 (L) 04/10/2022  ? HCT 25.2 (L) 04/10/2022  ? MCV 91.6 04/10/2022  ? PLT 97 (L) 04/10/2022  ? ? ?  Latest Ref Rng & Units 04/11/2022  ?  5:20 AM  ?CMP  ?Glucose 70 - 99 mg/dL 105    ? ?Edinburgh Score: ? ?  04/11/2022  ?  1:09 AM  ?EdFlavia Shipperostnatal Depression Scale Screening Tool  ?I have been able to laugh and see the funny side of things. 1  ?I have looked forward with enjoyment to things. 0  ?I have blamed myself unnecessarily when things went wrong. 3  ?I have been anxious or worried for no good reason. 2  ?I have felt scared or panicky for no good reason. 0  ?Things have been getting on top of me. 1  ?I have been so unhappy that I have had difficulty sleeping. 1  ?I have felt sad or miserable. 1  ?I have been so unhappy that I  have been crying. 1  ?The thought of harming myself has occurred to me. 0  ?Edinburgh Postnatal Depression Scale Total 10  ? ? ? ?After visit meds:  ?Allergies as of 04/11/2022   ?No Known Allergies ?  ? ?  ?Medication List  ?  ? ?STOP taking these medications   ? ?Accu-Chek Guide test strip ?Generic drug: glucose blood ?  ?Accu-Chek Softclix Lancets lancets ?  ?valACYclovir 500 MG tablet ?Commonly known as: Valtrex ?  ? ?  ? ?TAKE these medications   ? ?acetaminophen 325 MG tablet ?Commonly known as: Tylenol ?Take 2 tablets (650 mg total) by mouth every 4 (four) hours as needed (for pain scale < 4). ?  ?clonazePAM 0.5 MG tablet ?Commonly known as: KLONOPIN ?Take 0.5 mg by mouth 2 (two) times daily as needed for anxiety. ?  ?furosemide 20 MG  tablet ?Commonly known as: Lasix ?Take 1 tablet (20 mg total) by mouth daily for 4 days. ?  ?ibuprofen 600 MG tablet ?Commonly known as: ADVIL ?Take 1 tablet (600 mg total) by mouth every 6 (six) hours as needed. ?  ?prenatal multivitamin Tabs tablet ?Take 1 tablet by mouth daily at 12 noon. ?  ?Suboxone 4-1 MG Film ?Generic drug: Buprenorphine HCl-Naloxone HCl ?Place 0.5 Film under the tongue daily. ?  ? ?  ? ? ? ?Discharge home in stable condition ?Infant Feeding: Bottle and Breast ?Infant Disposition:Hopefully home with mother ?Discharge instruction: per After Visit Summary and Postpartum booklet. ?Activity: Advance as tolerated. Pelvic rest for 6 weeks.  ?Diet: routine diet ?Future Appointments: ?Future Appointments  ?Date Time Provider [place]  ?05/21/2022  9:30 AM [facility]  ?05/21/2022 10:55 AM Donnamae Jude, MD [facility]  ? ?Follow up Visit: ? ? ?Please schedule this patient for a In person postpartum visit in  4-6 weeks  with the following provider: Any provider. ?Additional Postpartum F/U:2 hour GTT  ?High risk pregnancy complicated by: GDM and gestational thrombocytopenia.  ?Delivery mode:  Vaginal, Vacuum (Extractor)  ?Anticipated Birth Control:  OCPs ? ? ?04/11/2022 ?[name], DO ? ? ? ?

## 2022-04-09 NOTE — Progress Notes (Signed)
Patient's BP WNL, called to confirm starting Procardia. Dr [name] said to hold Procardia today and start tomorrow. [facility] will follow pt's BP.  ?

## 2022-04-10 LAB — CBC
HCT: 25.2 % — ABNORMAL LOW (ref 36.0–46.0)
Hemoglobin: 8.8 g/dL — ABNORMAL LOW (ref 12.0–15.0)
MCH: 32 pg (ref 26.0–34.0)
MCHC: 34.9 g/dL (ref 30.0–36.0)
MCV: 91.6 fL (ref 80.0–100.0)
Platelets: 97 10*3/uL — ABNORMAL LOW (ref 150–400)
RBC: 2.75 MIL/uL — ABNORMAL LOW (ref 3.87–5.11)
RDW: 13.1 % (ref 11.5–15.5)
WBC: 12.2 10*3/uL — ABNORMAL HIGH (ref 4.0–10.5)
nRBC: 0 % (ref 0.0–0.2)

## 2022-04-10 LAB — GLUCOSE, RANDOM: Glucose, Bld: 82 mg/dL (ref 70–99)

## 2022-04-10 MED ORDER — BUPRENORPHINE HCL-NALOXONE HCL 2-0.5 MG SL SUBL
1.0000 | SUBLINGUAL_TABLET | Freq: Every day | SUBLINGUAL | Status: DC
  Administered 2022-04-11: 1 via SUBLINGUAL
  Filled 2022-04-10 (×2): qty 1

## 2022-04-10 MED ORDER — SODIUM CHLORIDE 0.9 % IV SOLN
500.0000 mg | Freq: Once | INTRAVENOUS | Status: AC
  Administered 2022-04-10: 500 mg via INTRAVENOUS
  Filled 2022-04-10: qty 25

## 2022-04-10 MED ORDER — CLONAZEPAM 0.5 MG PO TABS: 0.5000 mg | ORAL_TABLET | Freq: Two times a day (BID) | ORAL | Status: DC | PRN

## 2022-04-10 NOTE — Lactation Note (Signed)
This note was copied from a baby's chart. ?Lactation Consultation Note ? ?Patient Name: Aimee Guzman ?Today's Date: 04/10/2022 ?Reason for consult: Follow-up assessment;Mother's request;Difficult latch;Term;Breastfeeding assistance;Other (Comment) (Suboxone (L3)) ?Age:35 hours ? ?Mom painful latch with compression stripe from feeding prior. LC assisted getting more depth on the breast with signs of milk transfer.  ?LC reviewed flange size and applied coconut oil nipple prior to pumping eased discomfort. Mom aware with pumping flange size can change to adjust accordingly.  ? ?Plan 1. To feed based on cues 8-12x 24hr period. Mom to offer breasts and look for signs of milk transfer.  ?2. Mom to supplement, BF supplementation guide provided.  ?3. DEBP q 3hrs for 15 min  ? ?All questions answered at the end of the visit.  ? ?Maternal Data ?Has patient been taught Hand Expression?: Yes ? ?Feeding ?Mother's Current Feeding Choice: Breast Milk and Formula ?Nipple Type: Slow - flow ? ?LATCH Score ?Latch: Repeated attempts needed to sustain latch, nipple held in mouth throughout feeding, stimulation needed to elicit sucking reflex. ? ?Audible Swallowing: Spontaneous and intermittent ? ?Type of Nipple: Everted at rest and after stimulation ? ?Comfort (Breast/Nipple): Soft / non-tender ? ?Hold (Positioning): Assistance needed to correctly position infant at breast and maintain latch. ? ?LATCH Score: 8 ? ? ?Lactation Tools Discussed/Used ?Tools: Pump;Flanges;Coconut oil (Mom provided with coconut oil for nipple care.) ?Flange Size: 24 ?Breast pump type: Double-Electric Breast Pump ?Pump Education: Setup, frequency, and cleaning;Milk Storage ?Reason for Pumping: increase stimulation ?Pumping frequency: every 3 hrs for 15 min ? ?Interventions ?Interventions: Breast feeding basics reviewed;Assisted with latch;Skin to skin;Breast massage;Hand express;Breast compression;Adjust position;Support pillows;Position options;Expressed  milk;Coconut oil;DEBP;Education;Infant Driven Feeding Algorithm education ? ?Discharge ?Pump: DEBP;Personal ? ?Consult Status ?Consult Status: Follow-up ?Date: 04/11/22 ?Follow-up type: In-patient ? ? ? ?Bevin Das  Nicholson-Springer ?04/10/2022, 12:25 PM ? ? ? ?

## 2022-04-10 NOTE — Clinical Social Work Maternal (Signed)
?CLINICAL SOCIAL WORK MATERNAL/CHILD NOTE ? ?Patient Details  ?Name: Aimee Guzman ?MRN: [id number] ?Date of Birth: 11/20/1986 ? ?Date:  04/10/2022 ? ?Clinical Social Worker Initiating Note:  Kenley Troop, LCSW Date/Time: Initiated:  04/10/22/1145    ? ?Child's Name:  Aimee Guzman  ? ?Biological Parents:  Mother, Father (MOB: Aimee Guzman 08/30/1987, FOB: Aimee Guzman 01-08-1992)  ? ?Need for Interpreter:  None  ? ?Reason for Referral:  Current Substance Use/Substance Use During Pregnancy  , Behavioral Health Concerns  ? ?Address:  [address] ?[place] [address]  ?  ?Phone number:  [phone] (home)    ? ?Additional phone number:  ? ?Household Members/Support Persons (HM/SP):   Household Member/Support Person 1, Household Member/Support Person 2 ? ? ?HM/SP Name Relationship DOB or Age  ?HM/SP -1 Aimee Guzman Spouse 01-08-1992  ?HM/SP -2 Aimee Guzman Daughter 06-11-2019  ?HM/SP -3        ?HM/SP -4        ?HM/SP -5        ?HM/SP -6        ?HM/SP -7        ?HM/SP -8        ? ? ?Natural Supports (not living in the home):  Parent  ? ?Professional Supports: Organized support group (Comment) (Narcotic Anonymous-12 Step Fellowship/NA sponsor)  ? ?Employment: Unemployed (FOB self employed.)  ? ?Type of Work:    ? ?Education:  College graduate  ? ?Homebound arranged:   ? ?Financial Resources:  Private Insurance   ? ?Other Resources:     ? ?Cultural/Religious Considerations Which May Impact Care:   ? ?Strengths:  Ability to meet basic needs  , Home prepared for child  , Psychotropic Medications, Understanding of illness, Compliance with medical plan    ? ?Psychotropic Medications:  Adderal, Klonopin, Suboxone     ? ?Pediatrician:      ? ?Pediatrician List:  ? ?[place]    ?[place]    ?[place]    ?[place]    ?[place]    ?[place]    ? ? ?Pediatrician Fax Number:   ? ?Risk Factors/Current Problems:  Substance Use  , Mental Health Concerns    ? ?Cognitive State:  Able to  Concentrate  , Insightful  , Alert  , Linear Thinking    ? ?Mood/Affect:  Bright  , Calm  , Interested  , Comfortable  , Relaxed    ? ?CSW Assessment: CSW received consult for drug exposed newborn. CSW also noted ADHD, and Anxiety. CSW met with MOB to offer support and complete assessment.   ? ?CSW met with MOB at bedside and introduced CSW role. CSW observed MOB consoling the crying infant and FOB up in the room to provide support. CSW offered to return at a different time. MOB welcomed CSW visit and gave CSW permission to share all information with FOB present. MOB presented pleasant and engaged well with CSW during the assessment. MOB identified FOB and her father as her primary supports. MOB reported she lives with her spouse Aimee Guzman and her daughter Aimee Guzman. MOB reported that she is currently unemployed, and FOB is self-employed.  ? ?CSW inquired about MOB mental health history. MOB acknowledged that she has anxiety and ADHD. MOB reported she has a prescription for Adderall to treat ADHD symptoms however she chose not to take Adderall during the pregnancy. MOB reported she is prescribed Klonopin 1mg daily for anxiety symptoms however she takes it as needed.   MOB reported Dr. Runheim at [facility] prescribes the Adderall and Klonopin. MOB reported both medications are helpful at decreasing her symptoms. MOB reported she was enrolled in therapy at [facility] but stopped going in March 2023 because the cost was more than she could afford. MOB reported she is open to seeing a private counselor. CSW provided MOB with a list of mental health resources to follow up. CSW provided education regarding the baby blues period vs. perinatal mood disorders, discussed treatment. CSW recommended MOB complete a self-evaluation during the postpartum time period using the New Mom Checklist from Postpartum Progress and encouraged MOB to contact a medical professional if symptoms are noted at any  time. MOB reported she feels comfortable reaching out to her doctor if she has concerns.   ? ?CSW inquired about MOB controlled substance use and THC use during the pregnancy. MOB provided her substance history stating that her oldest child was born positive for opioids in 2020 and [facility] filed a report with [place] CPS. MOB reported her spouse and father made additional CPS reports in March 2021 for substance use. MOB reported she was experiencing postpartum depression with no relief but did not know it at the time. MOB reported she went to [facility] for residential treatment on March 18, 2020, with completion of the program on May 17. 2021. MOB reported the CPS case was closed May 2021 and she still has custody of her daughter. MOB reported she also completed their outpatient treatment program and continued care through the counseling program until recently. MOB reported she is currently active with Narcotics Anonymous ?[place]?-12 Step Fellowship and has a sponsor. MOB reported she has a home group and does service work. MOB reported the NA meetings are daily via zoom starting at 7:00 am sometimes multiple times a day. CSW provided actively listening and acknowledged MOB efforts. FOB acknowledged MOB efforts and stated that he is proud of her.    ? ?MOB expressed early in the pregnancy she started having opioid cravings. MOB reported [facility] prescribed Naltrexone since she did not want the infant to be exposed to Buprenorphine. MOB reported it was difficult to transition from Suboxone to Naltrexone without having the cravings. MOB reported her OB provider Dr. Eckstat helped to manage her medication regimen Suboxone 2mg daily which MOB feels is helpful. MOB reported her goal is to be on the medication for a year then quit in hopes that she can manage without it. CSW acknowledge MOB efforts. CSW inquired about MOB THC use during the pregnancy. MOB reported she  started using THC in February 2023, she vaped THC during the pregnancy because it helped with anxiety and sleep. MOB reported she was trying not to take the Klonopin. MOB reported she purchased the THC at a store. MOB reported the last time she used THC was yesterday. CSW informed MOB about the hospital drug screen policy. MOB made aware that CSW will will monitor the infant's UDS/CDS and make a report if the infant is positive for any illegal substances or any other substance that she is not prescribed. MOB informed that CSW will make a notification to CPS for in utero substance exposure for Suboxone, Naltrexone and Klonopin. MOB reported understanding. CSW discussed NAS. MOB and FOB reported they are well informed about NAS as they learned about NAS at [facility] with older child. MOB understands the infant will stay inpatient at least five days before discharge.  ? ?MOB   reported she has items for the infant including a bassinet where the infant will sleep. MOB reported she is still deciding on a pediatrician. CSW provided review of Sudden Infant Death Syndrome (SIDS) precautions. CSW assessed MOB for additional needs. MOB reported no further needs.  ? ?CSW will follow the infant's UDS/CDS and make a report to CPS, if warranted. ?CSW will also make a notification to CPS for in utero substance exposure.  ? ?CSW identifies no further need for intervention and no barriers to discharge at this time. ? ?CSW Plan/Description:  Sudden Infant Death Syndrome (SIDS) Education, CSW Will Continue to Monitor Umbilical Cord Tissue Drug Screen Results and Make Report if Warranted, Hospital Drug Screen Policy Information, Perinatal Mood and Anxiety Disorder (PMADs) Education, No Further Intervention Required/No Barriers to Discharge  ? ? ?Mozell Hardacre A Malcomb Gangemi, LCSW ?04/10/2022, 3:03 PM ? ?

## 2022-04-10 NOTE — Progress Notes (Addendum)
POSTPARTUM PROGRESS NOTE ? ?Subjective: Aimee Guzman is a 35 y.o. [id number] s/p VAVD at [redacted]w[redacted]d.  She reports she doing well. No acute events overnight. She denies any problems with ambulating, voiding or po intake. Denies nausea or vomiting. She has  passed flatus. Pain is well controlled.  Lochia is moderate. ? ?Objective: ?Blood pressure 118/76, pulse 98, temperature 98.7 ?F (37.1 ?C), temperature source Oral, resp. rate 18, height 5\' 6"  (1.676 m), weight 84.4 kg, last menstrual period 06/26/2021, SpO2 98 %, unknown if currently breastfeeding. ? ?Physical Exam:  ?General: alert, cooperative and no distress ?Chest: no respiratory distress ?Abdomen: soft, non-tender  ?Uterine Fundus: firm and at level of umbilicus ?Extremities: No calf swelling or tenderness  No edema ? ?Recent Labs  ?  04/09/22 ?1129 04/10/22 ?0246  ?HGB 11.2* 8.8*  ?HCT 31.4* 25.2*  ? ? ?Assessment/Plan: ?Aimee Guzman is a 36 y.o. CQ:[id number] s/p VAVD at [redacted]w[redacted]d for A1GDM. ? ?Routine Postpartum Care: Doing well, pain well-controlled.  ?-- Continue routine care, lactation support  ?-- Contraception: POPs ?-- Feeding: Breast feeding ? ?A1GDM: AM fasting glucose is 92 and stable. AM fasting glucose ? ?Anemia: CBC today was 8.8. Patient is asymptomatic, will give IV Venofer and monitor closely.  ? ?Suboxone therapy: Continue daily Suboxone therapy ? ? ? ?Dispo: Plan for discharge tomorrow or the following day. ? ?Alen Bleacher, MD ?[facility] ?04/10/2022 7:45 AM  ?

## 2022-04-11 ENCOUNTER — Ambulatory Visit: Payer: Self-pay

## 2022-04-11 LAB — GLUCOSE, RANDOM: Glucose, Bld: 105 mg/dL — ABNORMAL HIGH (ref 70–99)

## 2022-04-11 MED ORDER — IBUPROFEN 600 MG PO TABS: 600.0000 mg | ORAL_TABLET | Freq: Four times a day (QID) | ORAL | 0 refills | Status: DC | PRN

## 2022-04-11 MED ORDER — ACETAMINOPHEN 325 MG PO TABS: 650.0000 mg | ORAL_TABLET | ORAL | Status: DC | PRN

## 2022-04-11 MED ORDER — FUROSEMIDE 20 MG PO TABS: 20.0000 mg | ORAL_TABLET | Freq: Every day | ORAL | 0 refills | Status: DC

## 2022-04-11 NOTE — Lactation Note (Signed)
This note was copied from a baby's chart. ?Lactation Consultation Note ? ?Patient Name: Aimee Guzman ?Today's Date: 04/11/2022 ?Reason for consult: Follow-up assessment;Mother's request;Difficult latch ?Age:35 hours ?P2, term female infant, -6% weight loss. ?Mom wanted latch assistance due having pinching with breastfeeding, mom had been latching infant every other feeding due nipple pain. ?Mom latched infant with depth on her left breast, using the football hold position, infant sustained latch, per mom, no pain with latch. ?Infant breastfeed for 17 minutes on mom's left breast and 7 minutes on right breast using the football hold position, total feeding was 24 minutes. ?Going forward mom plans to latch infant at breast for every feeding, will try latch infant on both breast during a feeding. ?Afterwards dad supplementing infant with formula, infant has consumed 20 mls while LC was in the room, dad plans to offer 25 mls according to breastfeeding supplemental sheet.  ?Mom asked LC help with her using the DEBP, due to pinching and pain when using pump, mom re-fitted with 27 mm breast flange which she felt was comfortable, mom was still pumping when LC left the room. ?Mom's current plan: ?1- Mom will continue to breastfeed infant according to cues, on demand, 8 to 12+ times within 24 hours, skin to skin. ?2- Mom will continue to ask for RN/LC for latch assistance if needed. ?3- Mom will continue to supplement infant after latching infant at the breast per feeding Day 3 ( 18-25 mls). ?4- Mom will continue to use DEBP every 3 hours for 15 minutes on initial setting, mom will offer infant any her EBM from pumping first before formula.  ?Maternal Data ?  ? ?Feeding ?Mother's Current Feeding Choice: Breast Milk and Formula ? ?LATCH Score ?Latch: Grasps breast easily, tongue down, lips flanged, rhythmical sucking. ? ?Audible Swallowing: Spontaneous and intermittent ? ?Type of Nipple: Everted at rest and after  stimulation ? ?Comfort (Breast/Nipple): Soft / non-tender ? ?Hold (Positioning): Assistance needed to correctly position infant at breast and maintain latch. ? ?LATCH Score: 9 ? ? ?Lactation Tools Discussed/Used ?Flange Size: 27 (LC re-fitted mom with large flange due to pinching with 24 mm breast flange.) ? ?Interventions ?Interventions: Skin to skin;Assisted with latch;Position options;Support pillows;Adjust position;Breast compression;DEBP;Education;Pace feeding ? ?Discharge ?  ? ?Consult Status ?Consult Status: Follow-up ?Date: 04/12/22 ?Follow-up type: In-patient ? ? ? ?[name] ?04/11/2022, 7:46 PM ? ? ? ?

## 2022-04-11 NOTE — Plan of Care (Signed)
?  Problem: Education: ?Goal: Knowledge of General Education information will improve ?Description: Including pain rating scale, medication(s)/side effects and non-pharmacologic comfort measures ?Outcome: Completed/Met ?  ?Problem: Clinical Measurements: ?Goal: Ability to maintain clinical measurements within normal limits will improve ?Outcome: Completed/Met ?Goal: Will remain free from infection ?Outcome: Completed/Met ?Goal: Diagnostic test results will improve ?Outcome: Completed/Met ?Goal: Respiratory complications will improve ?Outcome: Completed/Met ?Goal: Cardiovascular complication will be avoided ?Outcome: Completed/Met ?  ?Problem: Activity: ?Goal: Risk for activity intolerance will decrease ?Outcome: Completed/Met ?  ?Problem: Nutrition: ?Goal: Adequate nutrition will be maintained ?Outcome: Completed/Met ?  ?Problem: Elimination: ?Goal: Will not experience complications related to bowel motility ?Outcome: Completed/Met ?Goal: Will not experience complications related to urinary retention ?Outcome: Completed/Met ?  ?Problem: Pain Managment: ?Goal: General experience of comfort will improve ?Outcome: Completed/Met ?  ?Problem: Safety: ?Goal: Ability to remain free from injury will improve ?Outcome: Completed/Met ?  ?Problem: Skin Integrity: ?Goal: Risk for impaired skin integrity will decrease ?Outcome: Completed/Met ?  ?Problem: Education: ?Goal: Knowledge of General Education information will improve ?Description: Including pain rating scale, medication(s)/side effects and non-pharmacologic comfort measures ?Outcome: Completed/Met ?  ?Problem: Clinical Measurements: ?Goal: Ability to maintain clinical measurements within normal limits will improve ?Outcome: Completed/Met ?Goal: Will remain free from infection ?Outcome: Completed/Met ?Goal: Diagnostic test results will improve ?Outcome: Completed/Met ?Goal: Respiratory complications will improve ?Outcome: Completed/Met ?Goal: Cardiovascular complication  will be avoided ?Outcome: Completed/Met ?  ?Problem: Activity: ?Goal: Risk for activity intolerance will decrease ?Outcome: Completed/Met ?  ?Problem: Nutrition: ?Goal: Adequate nutrition will be maintained ?Outcome: Completed/Met ?  ?Problem: Elimination: ?Goal: Will not experience complications related to bowel motility ?Outcome: Completed/Met ?Goal: Will not experience complications related to urinary retention ?Outcome: Completed/Met ?  ?Problem: Pain Managment: ?Goal: General experience of comfort will improve ?Outcome: Completed/Met ?  ?Problem: Safety: ?Goal: Ability to remain free from injury will improve ?Outcome: Completed/Met ?  ?Problem: Skin Integrity: ?Goal: Risk for impaired skin integrity will decrease ?Outcome: Completed/Met ?  ?

## 2022-04-11 NOTE — Progress Notes (Signed)
[place] ? ?04/11/2022 ? ?Aimee Guzman ?Mar 05, 1986 ?[id number] ? ? ?[place] ? ?04/11/2022 ? ?Girl Aimee Guzman ?04/09/2021 ?[id number] ? ? ?Current Barriers:  ?Umbilical Cord Tissue Drug Screen Results:  Still pending as of 4:30 pm on 04/11/2022. ?Newborn Rapid Urine Drug Screen Results:  Positive for Tetrahydrocannabinol. ?MOB "admitted to smoking marijuana during pregnancy, due to opioid cravings, via [place], and to self-medicate symptoms of anxiety". ?Unmet substance treatment need. ?MOB needs support, education, and care coordination, in order to meet this unmet need. ?Stage of Change: Currently undergoing treatment through Narcotics Anonymous and [employer]. ?Clinical Goal(s):  ?MOB will reduce self-medicating behavior and increase ability of: Coping skills, healthy habits, and self-management skills.   ?MOB verbalizes understanding of plan for management of Tetrahydrocannabinol. ?Explored community resource options for unmet needs related to Tetrahydrocannabinol. ?MOB verbalized basic understanding of Tetrahydrocannabinol.   ?MOB will attend all scheduled medical appointments, as evidenced by patient report and care team review of appointment completion in EMR. ?Interventions:  ?Collaboration with Attending Physician, Dr. Emeterio Reeve, via Epic note.   ?Inter-disciplinary care team collaboration, via Epic note. ?Assessed MOB's understanding of Tetrahydrocannabinol use, previous treatment, and what they would like to change. ?Assessed MOB's overall care coordination needs and provided basic substance abuse education.  ?Assessed for Suicidal Ideation/Homicidal Ideation:  None present. ?Completed screening, brief Intervention, and encouraged referral for substance abuse treatment, as well as support group involvement. ?Substance use treatment information provided. ?Explained to [facility] referral placement to Child Protective Services, with the  [place], per hospital drug screen policy, on 123456, due to positive newborn rapid urine drug screen for Tetrahydrocannabinol. ?Solution-Focused Strategies employed. ?Active listening / Reflection utilized.  ?Emotional Support provided. ?Participation in substance abuse counseling encouraged. ?Participation in substance abuse support group encouraged.  ?MOB verbalizes understanding of hospital drug screen policy for reporting drug exposed infants/newborns. ?Patient Goals:  ?Implement interventions discussed today. ?Refrain from Tetrahydrocannabinol use/abuse ?Refrain from exposing infant/newborn to Tetrahydrocannabinol. ?Continue to attend daily Narcotics Anonymous Support Groups via zoom. ?Continue to take psychotropic medications exactly as prescribed. ?[place] [employer] [phone]), as instructed, to file Child Protective Services report with [place], but no return call received, as of 4:56 pm.  LCSW to follow. ?  ?Nat Christen, BSW, MSW, LCSW  ?Licensed Clinical Social Worker  ?[facility]  ?Mailing Address-[address] [place], [place] [address] ?Physical Address-[address], [place], [place] [address] ?Toll Free Main # [phone] ?Fax # [phone] ?Cell # [phone] ?[email or web address] ?

## 2022-04-12 ENCOUNTER — Ambulatory Visit: Payer: Self-pay

## 2022-04-12 NOTE — Lactation Note (Addendum)
This note was copied from a baby's chart. ?Lactation Consultation Note ? ?Patient Name: Aimee Guzman ?Today's Date: 04/12/2022 ?Reason for consult: Follow-up assessment;1st time breastfeeding;Term ?Age:35 hours ?P2, term female infant -7% weight loss which was -1% more than when infant was at 56 hours of life. ?Per mom, she has been latching infant on both breast during a feeding today, infant is sustaining latch, she received latch assistance from RN earlier today,. ?Per mom, infant's total feedings at the breast has been 30 minutes in length, 15 minutes on each breast, afterwards she is supplementing infant with her EBM first , and then /formula.  ?LC entered the room, mom had infant latched on her left breast using the football hold position, infant latched with depth and was still breastfeeding after 7 minutes when LC left the room. ?Per mom, she is keeping infant awake at the breast  by taking infant of the breast if she becomes sleepy and burping infant, also, doing breast compressions, gently stoking infant's neck and shoulders.  ?Mom excited that she is now seeing small amount of colostrum when using the DEBP, mom will continue to pump every 3 hours for 15 minutes on initial setting. ?Mom know to supplement infant Day 4 after latching infant at breast with (28-42 mls ) of EBM/Formula per feeding and if infant doesn't latch 50 to 60 mls per feeding based on infant's age.  ? ?Maternal Data ?  ? ?Feeding ?Mother's Current Feeding Choice: Breast Milk and Formula ?Nipple Type: Slow - flow ? ?LATCH Score ?Latch: Grasps breast easily, tongue down, lips flanged, rhythmical sucking. ? ?Audible Swallowing: A few with stimulation ? ?Type of Nipple: Everted at rest and after stimulation ? ?Comfort (Breast/Nipple): Soft / non-tender ? ?Hold (Positioning): No assistance needed to correctly position infant at breast. ? ?LATCH Score: 9 ? ? ?Lactation Tools Discussed/Used ?  ? ?Interventions ?Interventions: Breast  compression;Skin to skin;Education ? ?Discharge ?  ? ?Consult Status ?Consult Status: Follow-up ?Date: 04/13/22 ?Follow-up type: In-patient ? ? ? ?[name] ?04/12/2022, 5:35 PM ? ? ? ?

## 2022-04-13 ENCOUNTER — Ambulatory Visit: Payer: Self-pay

## 2022-04-13 NOTE — Lactation Note (Signed)
This note was copied from a baby's chart. ?Lactation Consultation Note ? ?Patient Name: Girl Zenith Diss ?Today's Date: 04/13/2022 ?Reason for consult: Follow-up assessment ?Age:35 days ? ?Mother is breastfeeding, pumping and supplementing. ?She is pumping 10 ml. Reviewed milk storage. ?Reviewed engorgement care and monitoring voids/stools. ? ? ?Feeding ?Mother's Current Feeding Choice: Breast Milk and Formula ? ?LATCH Score ?Latch: Grasps breast easily, tongue down, lips flanged, rhythmical sucking. ? ?Audible Swallowing: A few with stimulation ? ?Type of Nipple: Everted at rest and after stimulation ? ?Comfort (Breast/Nipple): Soft / non-tender ? ?Hold (Positioning): Assistance needed to correctly position infant at breast and maintain latch. ? ?LATCH Score: 8 ? ? ?Lactation Tools Discussed/Used ?Tools: Pump ?Flange Size: 24 ?Breast pump type: Double-Electric Breast Pump ?Pumped volume: 10 mL ? ?Interventions ?  ? ?Discharge ?Discharge Education: Engorgement and breast care;Warning signs for feeding baby ?Pump: DEBP;Hands Free (Medela and Cozi) ? ?Consult Status ?Consult Status: Complete ?Date: 04/13/22 ? ? ? ?Vivianne Master Boschen ?04/13/2022, 10:00 AM ? ? ? ?

## 2022-04-15 DIAGNOSIS — R609 Edema, unspecified: Secondary | ICD-10-CM | POA: Diagnosis not present

## 2022-04-16 ENCOUNTER — Other Ambulatory Visit: Payer: Self-pay | Admitting: Family Medicine

## 2022-04-16 DIAGNOSIS — F1111 Opioid abuse, in remission: Secondary | ICD-10-CM

## 2022-04-16 MED ORDER — SUBOXONE 4-1 MG SL FILM: 0.5000 | ORAL_FILM | Freq: Every day | SUBLINGUAL | 0 refills | Status: DC

## 2022-04-20 ENCOUNTER — Telehealth (HOSPITAL_COMMUNITY): Payer: Self-pay | Admitting: *Deleted

## 2022-04-20 NOTE — Telephone Encounter (Signed)
Mom reports feeling good. No concerns about herself at this time. EPDS=6 [facility] score=10) Mom reports baby is doing well. Feeding, peeing, and pooping without difficulty. Safe sleep reviewed. Mom reports no concerns about baby at present.  Duffy Rhody, RN 04-20-2022 at 10:34am

## 2022-05-20 ENCOUNTER — Other Ambulatory Visit: Payer: Self-pay | Admitting: Family Medicine

## 2022-05-20 DIAGNOSIS — F1111 Opioid abuse, in remission: Secondary | ICD-10-CM

## 2022-05-20 MED ORDER — SUBOXONE 4-1 MG SL FILM: 0.5000 | ORAL_FILM | Freq: Every day | SUBLINGUAL | 0 refills | Status: DC

## 2022-05-20 NOTE — Telephone Encounter (Signed)
Rx approved. Has postpartum visit scheduled

## 2022-05-21 ENCOUNTER — Other Ambulatory Visit: Payer: BC Managed Care – PPO

## 2022-05-21 ENCOUNTER — Ambulatory Visit: Payer: BC Managed Care – PPO | Admitting: Family Medicine

## 2022-05-28 DIAGNOSIS — Z79899 Other long term (current) drug therapy: Secondary | ICD-10-CM | POA: Diagnosis not present

## 2022-05-28 DIAGNOSIS — F419 Anxiety disorder, unspecified: Secondary | ICD-10-CM | POA: Diagnosis not present

## 2022-05-28 DIAGNOSIS — R41840 Attention and concentration deficit: Secondary | ICD-10-CM | POA: Diagnosis not present

## 2022-05-28 DIAGNOSIS — G44229 Chronic tension-type headache, not intractable: Secondary | ICD-10-CM | POA: Diagnosis not present

## 2022-06-05 ENCOUNTER — Other Ambulatory Visit: Payer: Self-pay | Admitting: *Deleted

## 2022-06-05 DIAGNOSIS — O24429 Gestational diabetes mellitus in childbirth, unspecified control: Secondary | ICD-10-CM

## 2022-06-08 ENCOUNTER — Encounter: Payer: Self-pay | Admitting: Advanced Practice Midwife

## 2022-06-08 ENCOUNTER — Ambulatory Visit (INDEPENDENT_AMBULATORY_CARE_PROVIDER_SITE_OTHER): Payer: BC Managed Care – PPO | Admitting: Advanced Practice Midwife

## 2022-06-08 ENCOUNTER — Other Ambulatory Visit (INDEPENDENT_AMBULATORY_CARE_PROVIDER_SITE_OTHER): Payer: BC Managed Care – PPO

## 2022-06-08 ENCOUNTER — Other Ambulatory Visit (HOSPITAL_COMMUNITY)
Admission: RE | Admit: 2022-06-08 | Discharge: 2022-06-08 | Disposition: A | Discharge status: Home / Self Care | Payer: BC Managed Care – PPO | Source: Ambulatory Visit | Attending: Family Medicine | Admitting: Family Medicine

## 2022-06-08 ENCOUNTER — Other Ambulatory Visit: Payer: Self-pay

## 2022-06-08 VITALS — BP 102/64 | HR 66 | Wt 160.0 lb

## 2022-06-08 DIAGNOSIS — Z124 Encounter for screening for malignant neoplasm of cervix: Secondary | ICD-10-CM

## 2022-06-08 DIAGNOSIS — O99113 Other diseases of the blood and blood-forming organs and certain disorders involving the immune mechanism complicating pregnancy, third trimester: Secondary | ICD-10-CM | POA: Diagnosis not present

## 2022-06-08 DIAGNOSIS — R87610 Atypical squamous cells of undetermined significance on cytologic smear of cervix (ASC-US): Secondary | ICD-10-CM | POA: Insufficient documentation

## 2022-06-08 DIAGNOSIS — F53 Postpartum depression: Secondary | ICD-10-CM

## 2022-06-08 DIAGNOSIS — D696 Thrombocytopenia, unspecified: Secondary | ICD-10-CM

## 2022-06-08 DIAGNOSIS — F1111 Opioid abuse, in remission: Secondary | ICD-10-CM

## 2022-06-08 LAB — POCT PREGNANCY, URINE: Preg Test, Ur: NEGATIVE

## 2022-06-08 NOTE — Progress Notes (Signed)
Post Partum Visit Note  Aimee Guzman is a 36 y.o. G36P2012 female who presents for a postpartum visit. She is 8 weeks postpartum following a normal spontaneous vaginal delivery.  I have fully reviewed the prenatal and intrapartum course. The delivery was at [redacted]w[redacted]d gestational weeks.  Anesthesia: epidural. Postpartum course has been complicated by feeling tired, overwhelmed. Denies SI/HI. Husband is supportive but works long hours. Father visits each day. Continues NA meetings and MAT. Baby is doing well. Baby is feeding by both breast and bottle - Similac 360 . Bleeding no bleeding. Bowel function is normal. Bladder function is normal. Patient is sexually active. Contraception method is wants to discuss OCP (estrogen/progesterone). Last IC 1 week ago, unprotected. Postpartum depression screening: negative.   The pregnancy intention screening data noted above was reviewed. Potential methods of contraception were discussed. The patient elected to proceed with No data recorded.   Edinburgh Postnatal Depression Scale - 06/08/22 0823       Edinburgh Postnatal Depression Scale:  In the Past 7 Days   I have been able to laugh and see the funny side of things. 0    I have looked forward with enjoyment to things. 0    I have blamed myself unnecessarily when things went wrong. 0    I have been anxious or worried for no good reason. 2    I have felt scared or panicky for no good reason. 0    Things have been getting on top of me. 2    I have been so unhappy that I have had difficulty sleeping. 0    I have felt sad or miserable. 0    I have been so unhappy that I have been crying. 0    The thought of harming myself has occurred to me. 0    Edinburgh Postnatal Depression Scale Total 4             Health Maintenance Due  Topic Date Due   URINE MICROALBUMIN  Never done   PAP SMEAR-Modifier  04/30/2022    The following portions of the patient's history were reviewed and updated as  appropriate: allergies, current medications, past family history, past medical history, past social history, past surgical history, and problem list.  Review of Systems Pertinent items are noted in HPI.  Objective:  BP 102/64   Pulse 66   Wt 160 lb (72.6 kg)   LMP 06/26/2021   Breastfeeding Yes   BMI 25.82 kg/m    General:  alert, cooperative, appears stated age, fatigued, and no distress   Breasts:  Declined exam  Lungs: Regular rate. No respiratory distress  Heart:  regular rate   Abdomen: soft, non-tender; bowel sounds normal; no masses,  no organomegaly   Wound well approximated incision, located on the perineum and urethra, well-healed  GU exam:  normal. Uterus well-involuted       Assessment:    1. Postpartum depression  - CBC - TSH - Ambulatory referral to Integrated Behavioral Health  2. Screening for cervical cancer  - Cytology - PAP( [facility])  3. Gestational thrombocytopenia - Cbc  Normal postpartum exam.   Plan:   Essential components of care per ACOG recommendations:  1.  Mood and well being: Patient with negative depression screening today, but does report feeling overly tired, overwhelmed. Reviewed local resources for support.  - Patient tobacco use? No.   - hx of drug use? Yes. Stable on MAT, NA meetings.  2. Infant care and feeding:  -Patient currently breastmilk feeding? Yes. Reviewed importance of draining breast regularly to support lactation.  -Social determinants of health (SDOH) reviewed in EPIC. No concerns.  3. Sexuality, contraception and birth spacing - Patient does not want a pregnancy in the next year.  Desired family size is 2 children.  - Reviewed reproductive life planning. Reviewed contraceptive methods based on pt preferences and effectiveness.  Patient desired IUD or IUS today.   - Discussed birth spacing of 18 months  4. Sleep and fatigue -Encouraged family/partner/community support of 4 hrs of uninterrupted sleep to  help with mood and fatigue  5. Physical Recovery  - Discussed patients delivery and complications. She describes her labor as good. - Patient had a Vaginal, no problems at delivery. Patient had a  2nd degree perineal and perurethral  laceration. Perineal healing reviewed. Patient expressed understanding - Patient has urinary incontinence? No. - Patient is safe to resume physical and sexual activity. Instructed to abstain or use condoms until IUD placement in 1-2 weeks. Will repeat UPT due to unprotected IC.   6.  Health Maintenance - HM due items addressed Yes - Last pap smear No results found for: "DIAGPAP" Pap smear done at today's visit.  -Breast Cancer screening indicated? No.   7. Chronic Disease/Pregnancy Condition follow up: Gestational Diabetes. GTT scheduled in 1-2 weeks.  - PCP follow up  Dorathy Kinsman, CNM [facility] [employer], [facility]

## 2022-06-09 LAB — CBC
Hematocrit: 39 % (ref 34.0–46.6)
Hemoglobin: 12.9 g/dL (ref 11.1–15.9)
MCH: 29.7 pg (ref 26.6–33.0)
MCHC: 33.1 g/dL (ref 31.5–35.7)
MCV: 90 fL (ref 79–97)
Platelets: 175 10*3/uL (ref 150–450)
RBC: 4.34 x10E6/uL (ref 3.77–5.28)
RDW: 11.2 % — ABNORMAL LOW (ref 11.7–15.4)
WBC: 5 10*3/uL (ref 3.4–10.8)

## 2022-06-09 LAB — TSH: TSH: 2.56 u[IU]/mL (ref 0.450–4.500)

## 2022-06-11 LAB — CYTOLOGY - PAP
Comment: NEGATIVE
Diagnosis: UNDETERMINED — AB
High risk HPV: NEGATIVE

## 2022-06-12 ENCOUNTER — Encounter: Payer: Self-pay | Admitting: Advanced Practice Midwife

## 2022-06-12 DIAGNOSIS — A6920 Lyme disease, unspecified: Secondary | ICD-10-CM | POA: Insufficient documentation

## 2022-06-12 DIAGNOSIS — R87619 Unspecified abnormal cytological findings in specimens from cervix uteri: Secondary | ICD-10-CM | POA: Insufficient documentation

## 2022-06-16 ENCOUNTER — Other Ambulatory Visit: Payer: Self-pay | Admitting: Family Medicine

## 2022-06-16 DIAGNOSIS — A6000 Herpesviral infection of urogenital system, unspecified: Secondary | ICD-10-CM

## 2022-06-17 ENCOUNTER — Other Ambulatory Visit: Payer: Self-pay | Admitting: Family Medicine

## 2022-06-17 DIAGNOSIS — F1111 Opioid abuse, in remission: Secondary | ICD-10-CM

## 2022-06-17 MED ORDER — SUBOXONE 4-1 MG SL FILM
0.5000 | ORAL_FILM | Freq: Every day | SUBLINGUAL | 0 refills | Status: DC
Start: 2022-06-17 — End: 2022-07-27

## 2022-06-19 ENCOUNTER — Other Ambulatory Visit: Payer: BC Managed Care – PPO

## 2022-06-22 NOTE — BH Specialist Note (Unsigned)
Integrated Behavioral Health via Telemedicine Visit  07/02/2022 [name] [id number]  Number of Integrated Behavioral Health Clinician visits: 1- Initial Visit  Session Start time: 1522   Session End time: 1610  Total time in minutes: 48   Referring Provider: Dorathy Kinsman, CNM Patient/Family location: Home [facility] [place] Provider location: [facility] [employer] [facility]  All persons participating in visit: [name] and [facility] Aimee Guzman   Types of Service: Individual psychotherapy and Video visit  I connected with Aimee Guzman and/or Aimee Guzman's  n/a  via  Telephone or Video Enabled Telemedicine Application  (Video is Caregility application) and verified that I am speaking with the correct person using two identifiers. Discussed confidentiality: Yes   I discussed the limitations of telemedicine and the availability of in person appointments.  Discussed there is a possibility of technology failure and discussed alternative modes of communication if that failure occurs.  I discussed that engaging in this telemedicine visit, they consent to the provision of behavioral healthcare and the services will be billed under their insurance.  Patient and/or legal guardian expressed understanding and consented to Telemedicine visit: Yes   Presenting Concerns: Patient and/or family reports the following symptoms/concerns: Increased anxiety the past week, attributed to managing newborn, 36yo, pumping milk, abnormal pap, unable to obtain birth control pills due to insurance; pt coping with anxiety by deep breathing, 12-step program and meditation book; open to implementing additional self-coping strategies.  Duration of problem: Increase postpartum; Severity of problem: moderate  Patient and/or Family's Strengths/Protective Factors: Social connections, Social and Emotional competence, Concrete supports in place (healthy food, safe  environments, etc.), Sense of purpose, and Physical Health (exercise, healthy diet, medication compliance, etc.)  Goals Addressed: Patient will:  Reduce symptoms of: anxiety and stress   Increase knowledge and/or ability of: healthy habits and self-management skills   Demonstrate ability to: Increase healthy adjustment to current life circumstances and Increase adequate support systems for patient/family  Progress towards Goals: Ongoing  Interventions: Interventions utilized:  Mindfulness or Management consultant, Functional Assessment of ADLs, Psychoeducation and/or Health Education, and Link to [facility] Standardized Assessments completed: Not Needed  Patient and/or Family Response: Patient agrees with treatment plan.   Assessment: Patient currently experiencing Anxiety disoder, unspecified; hx ADHD and OUD,mild, in sustained remission.   Patient may benefit from psychoeducation and brief therapeutic interventions regarding coping with symptoms of anxiety and current life stress .  Plan: Follow up with behavioral health clinician on : Three weeks; Call Priti Consoli at [phone], as needed. Behavioral recommendations:  -Continue taking Suboxone daily; Klonopin as needed and prescribed -Continue using 12-step program strategies (deep breathing, meditation, sponsor, all as needed daily) -CALM relaxation breathing exercise twice daily (morning; at bedtime with sleep sounds); as needed throughout the day. -Consider apps and new mom support group information on After Visit Summary as needed Referral(s): Integrated Art gallery manager (In Clinic) and [employer]:  new mom support  I discussed the assessment and treatment plan with the patient and/or parent/guardian. They were provided an opportunity to ask questions and all were answered. They agreed with the plan and demonstrated an understanding of the instructions.   They were advised to call back or seek an  in-person evaluation if the symptoms worsen or if the condition fails to improve as anticipated.  Valetta Close [name], LCSW     06/24/2022   10:06 AM 04/01/2022    3:17 PM 03/26/2022    4:04 PM 03/18/2022  3:38 PM 02/27/2022    9:35 AM  Depression screen PHQ 2/9  Decreased Interest 0 0 0 0 0  Down, Depressed, Hopeless 0 0 0 0 0  PHQ - 2 Score 0 0 0 0 0  Altered sleeping 0 2 2 2  0  Tired, decreased energy 3 3 2 1 1   Change in appetite 0 0 0 0 0  Feeling bad or failure about yourself  1 0 0 0 0  Trouble concentrating 1 0 1 1 0  Moving slowly or fidgety/restless 1 0 0 0 0  Suicidal thoughts 0 0 0 0 0  PHQ-9 Score 6 5 5 4 1   Difficult doing work/chores    Not difficult at all       06/24/2022   10:06 AM 04/01/2022    3:17 PM 03/26/2022    4:04 PM 03/18/2022    3:38 PM  GAD 7 : Generalized Anxiety Score  Nervous, Anxious, on Edge 1 1 1 1   Control/stop worrying 0 0 0 0  Worry too much - different things 1 0 0 1  Trouble relaxing 1 1 1 1   Restless 0 0 0 0  Easily annoyed or irritable 1 0 1 0  Afraid - awful might happen 0 0 0 0  Total GAD 7 Score 4 2 3 3   Anxiety Difficulty    Not difficult at all

## 2022-06-24 ENCOUNTER — Ambulatory Visit (INDEPENDENT_AMBULATORY_CARE_PROVIDER_SITE_OTHER): Payer: BC Managed Care – PPO | Admitting: Family Medicine

## 2022-06-24 ENCOUNTER — Encounter: Payer: Self-pay | Admitting: Family Medicine

## 2022-06-24 ENCOUNTER — Other Ambulatory Visit: Payer: Self-pay

## 2022-06-24 ENCOUNTER — Other Ambulatory Visit: Payer: BC Managed Care – PPO

## 2022-06-24 VITALS — BP 108/73 | HR 59 | Ht 66.0 in | Wt 155.0 lb

## 2022-06-24 DIAGNOSIS — R87619 Unspecified abnormal cytological findings in specimens from cervix uteri: Secondary | ICD-10-CM

## 2022-06-24 DIAGNOSIS — O24429 Gestational diabetes mellitus in childbirth, unspecified control: Secondary | ICD-10-CM | POA: Diagnosis not present

## 2022-06-24 DIAGNOSIS — Z30011 Encounter for initial prescription of contraceptive pills: Secondary | ICD-10-CM

## 2022-06-24 DIAGNOSIS — Z3009 Encounter for other general counseling and advice on contraception: Secondary | ICD-10-CM

## 2022-06-24 MED ORDER — SLYND 4 MG PO TABS: 1.0000 | ORAL_TABLET | Freq: Every day | ORAL | 4 refills | Status: DC

## 2022-06-24 NOTE — Progress Notes (Signed)
Contraception/Family Planning VISIT ENCOUNTER NOTE  Subjective:   Aimee Guzman is a 36 y.o. G9P2012 female here for reproductive life counseling. The patient is currently using No Method - No Contraceptive Precautions to prevent pregnancy.    The patient does not want a pregnancy in the next year.    Client states they are looking for the following:  High efficacy at preventing pregnancy and Other worried about pain with IUD insertion and possibility of body pushing it out  Denies abnormal vaginal bleeding, discharge, pelvic pain, problems with intercourse or other gynecologic concerns.    Gynecologic History Patient's last menstrual period was 06/26/2021.  Health Maintenance Due  Topic Date Due   URINE MICROALBUMIN  Never done     The following portions of the patient's history were reviewed and updated as appropriate: allergies, current medications, past family history, past medical history, past social history, past surgical history and problem list.  Review of Systems Pertinent items are noted in HPI.   Objective:  BP 108/73   Pulse (!) 59   Ht 5\' 6"  (1.676 m)   Wt 155 lb (70.3 kg)   LMP 06/26/2021   Breastfeeding Yes   BMI 25.02 kg/m  Gen: well appearing, NAD HEENT: no scleral icterus CV: RR Lung: Normal WOB Ext: warm well perfused  PELVIC: deferred   Assessment and Plan:   Need for emergency contraceptive care was assessed today.  Patient is having regular unprotected sex- last was Sat/Sun which is > 120 hours and the prior event was about 2 weeks ago. Unable to be reasonably certain patient is not pregnant  Contraception counseling: Reviewed methods in a patient centered fashion and used shared decision making with the patient. Utilized [employer] patient education tools as appropriate. The patient stated there goals and desires from a method are: High efficacy at preventing pregnancy but without pain  We reviewed the following methods in detail based  on patient preferences available included: IUD or IUS and Oral Contraceptive WE discussed IUD insertion and expectations with pain and risk of expulsion  Patient expressed they would like Oral Contraceptive-- specifically POP due to currently breastfeeding.   Risks, benefits, and typical effectiveness rates were reviewed.  Questions were answered.  Written information was also given to the patient to review.    1. Encounter for other general counseling or advice on contraception See above  2. Abnormal cervical Papanicolaou smear, unspecified abnormal pap finding Reviewed past history of LEEP  in 2005 ish Last pap was ASCUS HPV neg Asked for ROI to be signed at front desk for pap smear results Reviewed repeat in 1-3 years  3. Encounter for initial prescription of contraceptive pills Reviewed need to take UPT in 2 weeks due to unprotected sex Discussed continuous dosing if desired Discussed the IUD placement can be done in the future - Drospirenone (SLYND) 4 MG TABS; Take 1 tablet by mouth daily.  Dispense: 72 tablet; Refill: 4  Face to face time:  30 minutes  Greater than 50% of the visit time was spent in counseling and coordination of care with the patient.  The summary and outline of the counseling and care coordination is summarized in the note above.   All questions were answered.  Please refer to After Visit Summary for other counseling recommendations.   Return in about 4 weeks (around 07/22/2022), or if symptoms worsen or fail to improve.  Future Appointments  Date Time Provider Department Center  07/02/2022  3:15 PM [facility] HEALTH CLINICIAN  [facility]     Federico Flake, MD

## 2022-06-25 LAB — GLUCOSE TOLERANCE, 2 HOURS
Glucose, 2 hour: 85 mg/dL (ref 70–139)
Glucose, GTT - Fasting: 75 mg/dL (ref 70–99)

## 2022-06-30 ENCOUNTER — Encounter: Payer: Self-pay | Admitting: Family Medicine

## 2022-06-30 DIAGNOSIS — Z30011 Encounter for initial prescription of contraceptive pills: Secondary | ICD-10-CM

## 2022-07-01 ENCOUNTER — Other Ambulatory Visit: Payer: Self-pay | Admitting: Family Medicine

## 2022-07-01 DIAGNOSIS — Z30011 Encounter for initial prescription of contraceptive pills: Secondary | ICD-10-CM

## 2022-07-02 ENCOUNTER — Encounter: Payer: Self-pay | Admitting: Family Medicine

## 2022-07-02 ENCOUNTER — Ambulatory Visit (INDEPENDENT_AMBULATORY_CARE_PROVIDER_SITE_OTHER): Payer: BC Managed Care – PPO | Admitting: Clinical

## 2022-07-02 DIAGNOSIS — F419 Anxiety disorder, unspecified: Secondary | ICD-10-CM

## 2022-07-02 DIAGNOSIS — F1111 Opioid abuse, in remission: Secondary | ICD-10-CM

## 2022-07-02 DIAGNOSIS — Z8659 Personal history of other mental and behavioral disorders: Secondary | ICD-10-CM

## 2022-07-02 MED ORDER — NORETHINDRONE 0.35 MG PO TABS: 1.0000 | ORAL_TABLET | Freq: Every day | ORAL | 13 refills | Status: DC

## 2022-07-02 NOTE — Patient Instructions (Signed)
[facility] [address] [place], [place] [address] [phone] (main office) [phone] (Latisha Lasch's office)  New Parent Support Groups www.postpartum.net www.conehealthybaby.com  /Emotional Wellbeing Apps and Websites Here are a few free apps meant to help you to help yourself.  To find, try searching on the internet to see if the app is offered on Apple/Android devices. If your first choice doesn't come up on your device, the good news is that there are many choices! Play around with different apps to see which ones are helpful to you.    Calm This is an app meant to help increase calm feelings. Includes info, strategies, and tools for tracking your feelings.      Calm Harm  This app is meant to help with self-harm. Provides many 5-minute or 15-min coping strategies for doing instead of hurting yourself.       Healthy Minds Health Minds is a problem-solving tool to help deal with emotions and cope with stress you encounter wherever you are.      MindShift This app can help people cope with anxiety. Rather than trying to avoid anxiety, you can make an important shift and face it.      MY3  MY3 features a support system, safety plan and resources with the goal of offering a tool to use in a time of need.       My Life My Voice  This mood journal offers a simple solution for tracking your thoughts, feelings and moods. Animated emoticons can help identify your mood.       Relax Melodies Designed to help with sleep, on this app you can mix sounds and meditations for relaxation.      Smiling Mind Smiling Mind is meditation made easy: it's a simple tool that helps put a smile on your mind.        Stop, Breathe & Think  A friendly, simple guide for people through meditations for mindfulness and compassion.  Stop, Breathe and Think Kids Enter your current feelings and choose a "mission" to help you cope. Offers  videos for certain moods instead of just sound recordings.       Team Orange The goal of this tool is to help teens change how they think, act, and react. This app helps you focus on your own good feelings and experiences.      The Virtual Hope Box The Virtual Hope Box (VHB) contains simple tools to help patients with coping, relaxation, distraction, and positive thinking.     

## 2022-07-14 NOTE — BH Specialist Note (Signed)
Integrated Behavioral Health via Telemedicine Visit  07/14/2022 Aimee Guzman [id number]  Number of Integrated Behavioral Health Clinician visits: 1- Initial Visit  Session Start time: 1522   Session End time: 1610  Total time in minutes: 48   Referring Provider: Dorathy Kinsman, CNM Patient/Family location: Home [facility] Provider location: [facility] [employer] [facility] for [facility]  All persons participating in visit: [name] and [facility] Thad Osoria   Types of Service: Individual psychotherapy and Video visit  I connected with Aimee Guzman and/or Aimee Guzman's  n/a  via  Telephone or Video Enabled Telemedicine Application  (Video is Caregility application) and verified that I am speaking with the correct person using two identifiers. Discussed confidentiality: Yes   I discussed the limitations of telemedicine and the availability of in person appointments.  Discussed there is a possibility of technology failure and discussed alternative modes of communication if that failure occurs.  I discussed that engaging in this telemedicine visit, they consent to the provision of behavioral healthcare and the services will be billed under their insurance.  Patient and/or legal guardian expressed understanding and consented to Telemedicine visit: Yes   Presenting Concerns: Patient and/or family reports the following symptoms/concerns: Concern about milk production slowing down after change in birth control; feeling on edge with most recent period; coping well with self-coping strategies. Duration of problem: Ongoing; Severity of problem: mild  Patient and/or Family's Strengths/Protective Factors: Social connections, Social and Emotional competence, Concrete supports in place (healthy food, safe environments, etc.), Sense of purpose, and Physical Health (exercise, healthy diet, medication compliance, etc.)  Goals Addressed: Patient will:   Maintain reduction of symptoms of: anxiety and depression   Demonstrate ability to: Increase healthy adjustment to current life circumstances  Progress towards Goals: Ongoing  Interventions: Interventions utilized:  Solution-Focused Strategies Standardized Assessments completed: GAD-7 and PHQ 9  Patient and/or Family Response: Patient agrees with treatment plan.  Assessment: Patient currently experiencing Anxiety disorder, unspecified, OUD, mild, in sustained remission and History of ADHD  Patient may benefit from continued therapeutic interventions.  Plan: Follow up with behavioral health clinician on : Call Modean Mccullum at [phone], as needed. Behavioral recommendations:  -Continue taking Suboxone and Klonopin as prescribed -Continue 12-step program with self-coping strategies as needed -Accept referral to lactation consultant Referral(s): Integrated [employer] (In Clinic)  I discussed the assessment and treatment plan with the patient and/or parent/guardian. They were provided an opportunity to ask questions and all were answered. They agreed with the plan and demonstrated an understanding of the instructions.   They were advised to call back or seek an in-person evaluation if the symptoms worsen or if the condition fails to improve as anticipated.  Valetta Close Kahleel Fadeley, LCSW     07/23/2022    3:55 PM 06/24/2022   10:06 AM 04/01/2022    3:17 PM 03/26/2022    4:04 PM 03/18/2022    3:38 PM  Depression screen PHQ 2/9  Decreased Interest 0 0 0 0 0  Down, Depressed, Hopeless 0 0 0 0 0  PHQ - 2 Score 0 0 0 0 0  Altered sleeping 0 0 2 2 2   Tired, decreased energy 1 3 3 2 1   Change in appetite 0 0 0 0 0  Feeling bad or failure about yourself  1 1 0 0 0  Trouble concentrating 0 1 0 1 1  Moving slowly or fidgety/restless 0 1 0 0 0  Suicidal thoughts 0 0 0  0 0  PHQ-9 Score 2 6 5 5 4   Difficult doing work/chores     Not difficult at all      07/23/2022    3:59 PM  06/24/2022   10:06 AM 04/01/2022    3:17 PM 03/26/2022    4:04 PM  GAD 7 : Generalized Anxiety Score  Nervous, Anxious, on Edge 0 1 1 1   Control/stop worrying 0 0 0 0  Worry too much - different things 0 1 0 0  Trouble relaxing 1 1 1 1   Restless 0 0 0 0  Easily annoyed or irritable 1 1 0 1  Afraid - awful might happen 0 0 0 0  Total GAD 7 Score 2 4 2  3

## 2022-07-16 ENCOUNTER — Encounter: Payer: Self-pay | Admitting: Family Medicine

## 2022-07-23 ENCOUNTER — Ambulatory Visit (INDEPENDENT_AMBULATORY_CARE_PROVIDER_SITE_OTHER): Payer: BC Managed Care – PPO | Admitting: Clinical

## 2022-07-23 DIAGNOSIS — F419 Anxiety disorder, unspecified: Secondary | ICD-10-CM

## 2022-07-23 DIAGNOSIS — F1111 Opioid abuse, in remission: Secondary | ICD-10-CM

## 2022-07-23 DIAGNOSIS — Z8659 Personal history of other mental and behavioral disorders: Secondary | ICD-10-CM

## 2022-07-25 ENCOUNTER — Other Ambulatory Visit: Payer: Self-pay | Admitting: Family Medicine

## 2022-07-25 DIAGNOSIS — F1111 Opioid abuse, in remission: Secondary | ICD-10-CM

## 2022-07-27 ENCOUNTER — Other Ambulatory Visit: Payer: Self-pay | Admitting: Family Medicine

## 2022-07-27 DIAGNOSIS — F1111 Opioid abuse, in remission: Secondary | ICD-10-CM

## 2022-07-28 MED ORDER — SUBOXONE 4-1 MG SL FILM
0.5000 | ORAL_FILM | Freq: Every day | SUBLINGUAL | 0 refills | Status: DC
Start: 2022-07-28 — End: 2022-08-23

## 2022-07-29 ENCOUNTER — Encounter: Payer: Self-pay | Admitting: Family Medicine

## 2022-07-29 ENCOUNTER — Other Ambulatory Visit: Payer: Self-pay

## 2022-07-29 ENCOUNTER — Ambulatory Visit (INDEPENDENT_AMBULATORY_CARE_PROVIDER_SITE_OTHER): Payer: BC Managed Care – PPO | Admitting: Family Medicine

## 2022-07-29 VITALS — BP 114/78 | HR 78 | Wt 153.2 lb

## 2022-07-29 DIAGNOSIS — Z3202 Encounter for pregnancy test, result negative: Secondary | ICD-10-CM

## 2022-07-29 DIAGNOSIS — Z30430 Encounter for insertion of intrauterine contraceptive device: Secondary | ICD-10-CM

## 2022-07-29 DIAGNOSIS — Z3009 Encounter for other general counseling and advice on contraception: Secondary | ICD-10-CM

## 2022-07-29 LAB — POCT PREGNANCY, URINE: Preg Test, Ur: NEGATIVE

## 2022-07-29 MED ORDER — LEVONORGESTREL 20.1 MCG/DAY IU IUD
1.0000 | INTRAUTERINE_SYSTEM | Freq: Once | INTRAUTERINE | Status: AC
  Administered 2022-07-29: 1 via INTRAUTERINE

## 2022-07-29 NOTE — Progress Notes (Signed)
   GYNECOLOGY PROBLEM  VISIT ENCOUNTER NOTE  Subjective:   Aimee Guzman is a 36 y.o. G23P2012 female here for a problem GYN visit.  Current complaints: Unsure of which IUD to get.   Denies abnormal vaginal bleeding, discharge, pelvic pain, problems with intercourse or other gynecologic concerns.    Gynecologic History No LMP recorded (lmp unknown).  Contraception: condoms  Health Maintenance Due  Topic Date Due   URINE MICROALBUMIN  Never done   INFLUENZA VACCINE  06/30/2022    The following portions of the patient's history were reviewed and updated as appropriate: allergies, current medications, past family history, past medical history, past social history, past surgical history and problem list.  Review of Systems Pertinent items are noted in HPI.   Objective:  BP 114/78   Pulse 78   Wt 153 lb 3.2 oz (69.5 kg)   LMP  (LMP Unknown)   Breastfeeding Yes   BMI 24.73 kg/m  Gen: well appearing, NAD HEENT: no scleral icterus CV: RR Lung: Normal WOB Ext: warm well perfused  PELVIC: Normal appearing external genitalia; normal appearing vaginal mucosa and cervix.  No abnormal discharge noted.    IUD Insertion Procedure Note Patient identified, informed consent performed, consent signed.   Discussed risks of irregular bleeding, cramping, infection, malpositioning or misplacement of the IUD outside the uterus which may require further procedure such as laparoscopy. Time out was performed.  Urine pregnancy test negative.  Speculum placed in the vagina.  Cervix visualized.  Cleaned with Betadine x 2.  Grasped anteriorly with a single tooth tenaculum.  Uterus sounded to 8 cm.  IUD placed per manufacturer's recommendations.  Strings trimmed to 3 cm. Tenaculum was removed, good hemostasis noted.  Patient tolerated procedure well.    Assessment and Plan:  1. Encounter for IUD insertion Patient was given post-procedure instructions.  She was advised to have backup contraception for  one week.  Patient was also asked to check IUD strings periodically and follow up in 4 weeks for IUD check.  2. Encounter for other general counseling or advice on contraception Reviewed options, discussed CU vs LNG  IUD Decided on LNG  Please refer to After Visit Summary for other counseling recommendations.   Return in about 4 weeks (around 08/26/2022) for IUD string check.  Federico Flake, MD, MPH, ABFM Attending Physician Faculty Practice- Center for [facility]

## 2022-07-29 NOTE — Addendum Note (Signed)
Addended byVidal Schwalbe on: 07/29/2022 04:47 PM   Modules accepted: Orders

## 2022-08-05 DIAGNOSIS — H52203 Unspecified astigmatism, bilateral: Secondary | ICD-10-CM | POA: Diagnosis not present

## 2022-08-05 DIAGNOSIS — H5015 Alternating exotropia: Secondary | ICD-10-CM | POA: Diagnosis not present

## 2022-08-05 DIAGNOSIS — H5213 Myopia, bilateral: Secondary | ICD-10-CM | POA: Diagnosis not present

## 2022-08-23 ENCOUNTER — Other Ambulatory Visit: Payer: Self-pay | Admitting: Family Medicine

## 2022-08-23 DIAGNOSIS — F1111 Opioid abuse, in remission: Secondary | ICD-10-CM

## 2022-08-24 MED ORDER — SUBOXONE 4-1 MG SL FILM: 0.5000 | ORAL_FILM | Freq: Every day | SUBLINGUAL | 0 refills | Status: DC

## 2022-08-24 NOTE — Telephone Encounter (Signed)
OUD on suboxone.  Hasn't had an appt for OUD recently.  Will need an appt for this.

## 2022-08-25 ENCOUNTER — Telehealth: Payer: Self-pay | Admitting: Family Medicine

## 2022-08-25 NOTE — Telephone Encounter (Signed)
Called patient to inform of appointment change, there was no answer to the phone call so a voicemail was left with the call back number for the office.

## 2022-09-01 ENCOUNTER — Ambulatory Visit: Payer: BC Managed Care – PPO | Admitting: Advanced Practice Midwife

## 2022-09-02 ENCOUNTER — Ambulatory Visit: Payer: BC Managed Care – PPO | Admitting: Obstetrics & Gynecology

## 2022-09-08 ENCOUNTER — Encounter: Payer: Self-pay | Admitting: Family Medicine

## 2022-09-08 ENCOUNTER — Ambulatory Visit (INDEPENDENT_AMBULATORY_CARE_PROVIDER_SITE_OTHER): Payer: BC Managed Care – PPO | Admitting: Family Medicine

## 2022-09-08 ENCOUNTER — Other Ambulatory Visit: Payer: Self-pay

## 2022-09-08 VITALS — BP 112/73 | HR 67

## 2022-09-08 DIAGNOSIS — R87610 Atypical squamous cells of undetermined significance on cytologic smear of cervix (ASC-US): Secondary | ICD-10-CM | POA: Diagnosis not present

## 2022-09-08 DIAGNOSIS — Z975 Presence of (intrauterine) contraceptive device: Secondary | ICD-10-CM | POA: Diagnosis not present

## 2022-09-08 DIAGNOSIS — Z30431 Encounter for routine checking of intrauterine contraceptive device: Secondary | ICD-10-CM | POA: Diagnosis not present

## 2022-09-08 DIAGNOSIS — F1111 Opioid abuse, in remission: Secondary | ICD-10-CM

## 2022-09-08 NOTE — Progress Notes (Signed)
   GYNECOLOGY OFFICE VISIT NOTE  History:   Aimee Guzman is a 36 y.o. [id number] here today for IUD string check and OUD follow up.  His Liletta placed 07/29/2022, strings trimmed to 3 cm Reports no issues since that time  Doing well on suboxone Taking 2 mg daily No issues  Health Maintenance Due  Topic Date Due   INFLUENZA VACCINE  06/30/2022    Past Medical History:  Diagnosis Date   Gestational diabetes    Gestational thrombocytopenia ([place])    HSV (herpes simplex virus) infection    Opioid use disorder    Vaginal Pap smear, abnormal     Past Surgical History:  Procedure Laterality Date   EYE SURGERY     LEEP     WRIST SURGERY      The following portions of the patient's history were reviewed and updated as appropriate: allergies, current medications, past family history, past medical history, past social history, past surgical history and problem list.   Health Maintenance:   Last pap: Lab Results  Component Value Date   DIAGPAP (A) 06/08/2022    - Atypical squamous cells of undetermined significance (ASC-US)   HPVHIGH Negative 06/08/2022     Last mammogram:  N/a    Review of Systems:  Pertinent items noted in HPI and remainder of comprehensive ROS otherwise negative.  Physical Exam:  BP 112/73   Pulse 67  CONSTITUTIONAL: Well-developed, well-nourished female in no acute distress.  HEENT:  Normocephalic, atraumatic. External right and left ear normal. No scleral icterus.  NECK: Normal range of motion, supple, no masses noted on observation SKIN: No rash noted. Not diaphoretic. No erythema. No pallor. MUSCULOSKELETAL: Normal range of motion. No edema noted. NEUROLOGIC: Alert and oriented to person, place, and time. Normal muscle tone coordination.  PSYCHIATRIC: Normal mood and affect. Normal behavior. Normal judgment and thought content. RESPIRATORY: Effort normal, no problems with respiration noted PELVIC: Normal appearing external genitalia; normal  appearing vaginal mucosa and cervix.  No abnormal discharge noted. IUD strings present and approximately 3 cm long as documented in prior procedure note  Labs and Imaging No results found for this or any previous visit (from the past 168 hour(s)). No results found.    Assessment and Plan:   Problem List Items Addressed This Visit       Other   Opioid use disorder, mild, in sustained remission ([place]) - Primary    Stable on 2 mg daily UDS today with verbal consent Follow up in 3 months      Relevant Orders   ToxASSURE Select 13 (MW), Urine   Abnormal Pap smear of cervix    Algorithm reviewed. As far as I can tell she would need 1 year follow up. Discussed with patient, delayed recall message sent to admin staff for next year.       IUD (intrauterine device) in place    Normal string check       Routine preventative health maintenance measures emphasized. Please refer to After Visit Summary for other counseling recommendations.   Return in about 3 months (around 12/09/2022) for OUD f/u.    Total face-to-face time with patient: 20 minutes.  Over 50% of encounter was spent on counseling and coordination of care.   Clarnce Flock, MD/MPH Attending Family Medicine Physician, [facility] for [facility], [place]

## 2022-09-08 NOTE — Assessment & Plan Note (Signed)
Stable on 2 mg daily UDS today with verbal consent Follow up in 3 months

## 2022-09-08 NOTE — Addendum Note (Signed)
Addended by: Georgia Lopes on: 09/08/2022 01:58 PM   Modules accepted: Orders

## 2022-09-08 NOTE — Assessment & Plan Note (Signed)
Algorithm reviewed. As far as I can tell she would need 1 year follow up. Discussed with patient, delayed recall message sent to admin staff for next year.

## 2022-09-08 NOTE — Assessment & Plan Note (Signed)
Normal string check

## 2022-09-23 ENCOUNTER — Other Ambulatory Visit: Payer: Self-pay | Admitting: Family Medicine

## 2022-09-23 DIAGNOSIS — F1111 Opioid abuse, in remission: Secondary | ICD-10-CM

## 2022-10-01 MED ORDER — SUBOXONE 4-1 MG SL FILM: 0.5000 | ORAL_FILM | Freq: Every day | SUBLINGUAL | 0 refills | Status: AC

## 2022-10-20 ENCOUNTER — Other Ambulatory Visit: Payer: Self-pay | Admitting: Family Medicine

## 2022-10-20 ENCOUNTER — Other Ambulatory Visit: Payer: BC Managed Care – PPO

## 2022-10-20 DIAGNOSIS — F1111 Opioid abuse, in remission: Secondary | ICD-10-CM

## 2022-10-24 LAB — TOXASSURE SELECT 13 (MW), URINE

## 2022-10-26 ENCOUNTER — Encounter: Payer: Self-pay | Admitting: Family Medicine

## 2022-10-26 DIAGNOSIS — F1111 Opioid abuse, in remission: Secondary | ICD-10-CM

## 2022-10-26 MED ORDER — BUPRENORPHINE HCL-NALOXONE HCL 4-1 MG SL FILM
0.5000 | ORAL_FILM | Freq: Two times a day (BID) | SUBLINGUAL | 0 refills | Status: DC
Start: 2022-10-26 — End: 2023-02-02

## 2022-11-08 DIAGNOSIS — R051 Acute cough: Secondary | ICD-10-CM | POA: Diagnosis not present

## 2022-11-08 DIAGNOSIS — B349 Viral infection, unspecified: Secondary | ICD-10-CM | POA: Diagnosis not present

## 2022-11-12 DIAGNOSIS — R41840 Attention and concentration deficit: Secondary | ICD-10-CM | POA: Diagnosis not present

## 2022-11-12 DIAGNOSIS — Z760 Encounter for issue of repeat prescription: Secondary | ICD-10-CM | POA: Diagnosis not present

## 2022-11-18 ENCOUNTER — Ambulatory Visit: Payer: BC Managed Care – PPO | Admitting: Family Medicine

## 2022-11-18 ENCOUNTER — Encounter: Payer: Self-pay | Admitting: Family Medicine

## 2022-12-03 ENCOUNTER — Encounter: Payer: Self-pay | Admitting: Family Medicine

## 2022-12-03 ENCOUNTER — Other Ambulatory Visit: Payer: Self-pay

## 2022-12-03 ENCOUNTER — Ambulatory Visit (INDEPENDENT_AMBULATORY_CARE_PROVIDER_SITE_OTHER): Payer: BC Managed Care – PPO | Admitting: Family Medicine

## 2022-12-03 VITALS — BP 118/74 | HR 89 | Ht 66.0 in | Wt 154.2 lb

## 2022-12-03 DIAGNOSIS — F1111 Opioid abuse, in remission: Secondary | ICD-10-CM

## 2022-12-03 MED ORDER — IBUPROFEN 800 MG PO TABS: 800.0000 mg | ORAL_TABLET | Freq: Three times a day (TID) | ORAL | 0 refills | Status: DC | PRN

## 2022-12-03 MED ORDER — BUPRENORPHINE HCL-NALOXONE HCL 4-1 MG SL FILM: 0.5000 | ORAL_FILM | Freq: Every day | SUBLINGUAL | 0 refills | Status: DC

## 2022-12-03 NOTE — Progress Notes (Signed)
GYNECOLOGY OFFICE VISIT NOTE  History:   Aimee Guzman is a 37 y.o. [id number] here today for OUD follow up.  Patient has not been seen since 09/08/2022 for follow up Today reports she and baby are doing well She continues to take 2mg  of suboxone daily Had extensive dental work last week and was not given any pain medications She admits she used a family members pain pills   Health Maintenance Due  Topic Date Due   INFLUENZA VACCINE  06/30/2022    Past Medical History:  Diagnosis Date   Gestational diabetes    Gestational thrombocytopenia ([place])    HSV (herpes simplex virus) infection    Opioid use disorder    Vaginal Pap smear, abnormal     Past Surgical History:  Procedure Laterality Date   EYE SURGERY     LEEP     WRIST SURGERY      The following portions of the patient's history were reviewed and updated as appropriate: allergies, current medications, past family history, past medical history, past social history, past surgical history and problem list.   Health Maintenance:   Last pap: Lab Results  Component Value Date   DIAGPAP (A) 06/08/2022    - Atypical squamous cells of undetermined significance (ASC-US)   HPVHIGH Negative 06/08/2022    Last mammogram:  N/a    Review of Systems:  Pertinent items noted in HPI and remainder of comprehensive ROS otherwise negative.  Physical Exam:  BP 118/74   Pulse 89   Ht 5\' 6"  (1.676 m)   Wt 154 lb 3.2 oz (69.9 kg)   BMI 24.89 kg/m  CONSTITUTIONAL: Well-developed, well-nourished female in no acute distress.  HEENT:  Normocephalic, atraumatic. External right and left ear normal. No scleral icterus.  NECK: Normal range of motion, supple, no masses noted on observation SKIN: No rash noted. Not diaphoretic. No erythema. No pallor. MUSCULOSKELETAL: Normal range of motion. No edema noted. NEUROLOGIC: Alert and oriented to person, place, and time. Normal muscle tone coordination.  PSYCHIATRIC: Normal mood and  affect. Normal behavior. Normal judgment and thought content. RESPIRATORY: Effort normal, no problems with respiration noted   Labs and Imaging No results found for this or any previous visit (from the past 168 hour(s)). No results found.    Assessment and Plan:   Problem List Items Addressed This Visit       Other   Opioid use disorder, mild, in sustained remission ([place]) - Primary    Patient admits to non-prescribed use of narcotics, though I do understand why she did this. Discussed that it is not standard of care to not provide pain medicines to patients on suboxone after procedures, and that this should have been done by her dental provider. I offered and she accepted a letter which stated as much since she is going back for more dental work next week. UDS collected today, refill sent for suboxone. Also sent rx for ibuprofen 800mg , will follow up with her in about a month.       Relevant Medications   Buprenorphine HCl-Naloxone HCl (SUBOXONE) 4-1 MG FILM   Other Relevant Orders   ToxASSURE Select 13 (MW), Urine     Return in about 4 weeks (around 12/31/2022) for OUD f/u.    Total face-to-face time with patient: 20 minutes.  Over 50% of encounter was spent on counseling and coordination of care.   Clarnce Flock, MD/MPH Attending Family Medicine Physician, [facility] for [facility], [facility]

## 2022-12-03 NOTE — Assessment & Plan Note (Signed)
Patient admits to non-prescribed use of narcotics, though I do understand why she did this. Discussed that it is not standard of care to not provide pain medicines to patients on suboxone after procedures, and that this should have been done by her dental provider. I offered and she accepted a letter which stated as much since she is going back for more dental work next week. UDS collected today, refill sent for suboxone. Also sent rx for ibuprofen 800mg , will follow up with her in about a month.

## 2022-12-06 LAB — TOXASSURE SELECT 13 (MW), URINE

## 2022-12-15 ENCOUNTER — Encounter: Payer: Self-pay | Admitting: Family Medicine

## 2022-12-15 NOTE — Telephone Encounter (Signed)
Message forwarded to Dione Plover, MD who asks for further information from dentist regarding planned procedure. Called dentist referenced in [place] message. Dentist office able to provide information that patient is scheduled for routine cleaning and crown cement. Dentist office states there is gum irritation usually. This procedure not routinely painful. They do not prescribe medication for home use for this procedure. If the provider needs more information they ask that the patient sign ROI. Reviewed with Dione Plover who states he does not recommend any pain medication at this time. MyChart message sent to patient.

## 2022-12-31 ENCOUNTER — Other Ambulatory Visit: Payer: Self-pay

## 2022-12-31 ENCOUNTER — Encounter: Payer: Self-pay | Admitting: Family Medicine

## 2022-12-31 ENCOUNTER — Ambulatory Visit (INDEPENDENT_AMBULATORY_CARE_PROVIDER_SITE_OTHER): Payer: BC Managed Care – PPO | Admitting: Family Medicine

## 2022-12-31 VITALS — BP 114/80 | HR 81 | Ht 66.0 in | Wt 153.0 lb

## 2022-12-31 DIAGNOSIS — F1111 Opioid abuse, in remission: Secondary | ICD-10-CM | POA: Diagnosis not present

## 2022-12-31 MED ORDER — BUPRENORPHINE HCL-NALOXONE HCL 4-1 MG SL FILM: 0.5000 | ORAL_FILM | Freq: Every day | SUBLINGUAL | 0 refills | Status: AC

## 2022-12-31 NOTE — Progress Notes (Signed)
   GYNECOLOGY OFFICE VISIT NOTE  History:   Aimee Guzman is a 37 y.o. [id number] here today for OUD follow up.  Doing well Did run into someone from the old days and feel some cravings Took full 4 mg strip and helped a lot but doesn't want to up her dose right now Has restarted taking her adderall and feels it helps a lot Looking for a PCP   Health Maintenance Due  Topic Date Due   INFLUENZA VACCINE  06/30/2022    Past Medical History:  Diagnosis Date   Gestational diabetes    Gestational thrombocytopenia ([facility])    HSV (herpes simplex virus) infection    Opioid use disorder    Vaginal Pap smear, abnormal     Past Surgical History:  Procedure Laterality Date   EYE SURGERY     LEEP     WRIST SURGERY      The following portions of the patient's history were reviewed and updated as appropriate: allergies, current medications, past family history, past medical history, past social history, past surgical history and problem list.   Health Maintenance:   Last pap: Lab Results  Component Value Date   DIAGPAP (A) 06/08/2022    - Atypical squamous cells of undetermined significance (ASC-US)   HPVHIGH Negative 06/08/2022    Last mammogram:  N/a    Review of Systems:  Pertinent items noted in HPI and remainder of comprehensive ROS otherwise negative.  Physical Exam:  BP 114/80   Pulse 81   Ht 5\' 6"  (1.676 m)   Wt 153 lb (69.4 kg)   LMP  (LMP Unknown)   Breastfeeding No   BMI 24.69 kg/m  CONSTITUTIONAL: Well-developed, well-nourished female in no acute distress.  HEENT:  Normocephalic, atraumatic. External right and left ear normal. No scleral icterus.  NECK: Normal range of motion, supple, no masses noted on observation SKIN: No rash noted. Not diaphoretic. No erythema. No pallor. MUSCULOSKELETAL: Normal range of motion. No edema noted. NEUROLOGIC: Alert and oriented to person, place, and time. Normal muscle tone coordination.  PSYCHIATRIC: Normal mood and  affect. Normal behavior. Normal judgment and thought content. RESPIRATORY: Effort normal, no problems with respiration noted   Labs and Imaging No results found for this or any previous visit (from the past 168 hour(s)). No results found.    Assessment and Plan:   Problem List Items Addressed This Visit       Other   Opioid use disorder, mild, in sustained remission ([place]) - Primary    UDS collected Doing well, wouldn't be bad to go up on her suboxone dose but she is against this and eventually hoping to wean off Refill sent Also sent message to Dr. Thompson Grayer to see about transferring her to [facility] at 1 year PP, she also happens to need a PCP in general      Relevant Medications   Buprenorphine HCl-Naloxone HCl (SUBOXONE) 4-1 MG FILM   Other Relevant Orders   ToxASSURE Select 13 (MW), Urine    Routine preventative health maintenance measures emphasized. Please refer to After Visit Summary for other counseling recommendations.   Return in about 4 weeks (around 01/28/2023) for OUD f/u.    Total face-to-face time with patient: 20 minutes.  Over 50% of encounter was spent on counseling and coordination of care.   Clarnce Flock, MD/MPH Attending Family Medicine Physician, [facility] for [facility], [place]

## 2022-12-31 NOTE — Assessment & Plan Note (Addendum)
UDS collected Doing well, wouldn't be bad to go up on her suboxone dose but she is against this and eventually hoping to wean off Refill sent Also sent message to Dr. Thompson Grayer to see about transferring her to [facility] at 1 year PP, she also happens to need a PCP in general

## 2023-01-05 LAB — TOXASSURE SELECT 13 (MW), URINE

## 2023-01-06 ENCOUNTER — Encounter: Payer: Self-pay | Admitting: Family Medicine

## 2023-02-02 ENCOUNTER — Encounter: Payer: Self-pay | Admitting: Family Medicine

## 2023-02-02 ENCOUNTER — Other Ambulatory Visit: Payer: Self-pay

## 2023-02-02 ENCOUNTER — Ambulatory Visit (INDEPENDENT_AMBULATORY_CARE_PROVIDER_SITE_OTHER): Payer: BC Managed Care – PPO | Admitting: Family Medicine

## 2023-02-02 VITALS — BP 108/72 | HR 65 | Ht 66.0 in | Wt 155.2 lb

## 2023-02-02 DIAGNOSIS — F1111 Opioid abuse, in remission: Secondary | ICD-10-CM

## 2023-02-02 DIAGNOSIS — F419 Anxiety disorder, unspecified: Secondary | ICD-10-CM

## 2023-02-02 DIAGNOSIS — F909 Attention-deficit hyperactivity disorder, unspecified type: Secondary | ICD-10-CM | POA: Diagnosis not present

## 2023-02-02 MED ORDER — AMPHETAMINE-DEXTROAMPHETAMINE 20 MG PO TABS: 20.0000 mg | ORAL_TABLET | Freq: Two times a day (BID) | ORAL | 0 refills | Status: DC

## 2023-02-02 MED ORDER — BUPRENORPHINE HCL-NALOXONE HCL 4-1 MG SL FILM: 0.5000 | ORAL_FILM | Freq: Every day | SUBLINGUAL | 0 refills | Status: DC

## 2023-02-02 MED ORDER — CLONAZEPAM 0.5 MG PO TABS: 0.5000 mg | ORAL_TABLET | Freq: Two times a day (BID) | ORAL | 0 refills | Status: DC | PRN

## 2023-02-02 NOTE — Assessment & Plan Note (Addendum)
Recurrent use of non prescribed opioids over the past few months, encouraged her to turn in all excess pain pill prescriptions to nearest pharmacy as she is playing with fire. Luckily she has been compliant with suboxone which does provide some protection from fatal overdose in setting of a more severe relapse. Patient referred to [facility] at last visit and currently has a visit to establish and transfer care later this month. Plan to transfer suboxone prescribing responsibilities to that clinic but reassured patient we are always happy to see her for any OBGYN issues she has in the future.  UDS collected today Refill sent
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
## 2023-02-02 NOTE — Assessment & Plan Note (Signed)
Long term prescription that goes back in PDMP as far as I can see with regular fills. Given difficulty contacting provider and danger of withdrawal seizures I have done a one time refill but encouraged her to continue reaching out to her neurologist. Also discussed this may be a good time to consider whether this is the best medication for her anxiety and if the benefits still outweigh the risks.

## 2023-02-02 NOTE — Assessment & Plan Note (Signed)
Long term med, no unusual fill history in PDMP, one time refill given and I encouraged her to continue trying to get in touch with her neurologist.

## 2023-02-04 LAB — TOXASSURE SELECT 13 (MW), URINE

## 2023-02-18 NOTE — Progress Notes (Unsigned)
    SUBJECTIVE:   CHIEF COMPLAINT / HPI:   New Patient  Patient presents today to establish with me as her new PCP. Previously was getting care with Dr. Dione Plover at [facility].   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 GDM, HSV2,  Anxiety on Klonopin.  There is mention in the chart of epilepsy, but she assures me this is not true and she does not have a history of seizures and has never been on anti-epileptic drugs. I do not see any documentation of seizures in the chart and will therefore adjust her problem list to reflect this.   Medications include Suboxone, Adderall, clonazepam. Adderall and Klonopin are managed by her neurologist Dr. Boyd Kerbs. I will be assuming her suboxone prescribing from Dr. Dione Plover. She reports using 2-4mg  of suboxone daily with no cravings. She is hopeful to discontinue this once she is a little further removed from the post-partum period.   Sees specialists for OB/Gyn, delivered 04/09/2022.  Family: Married with two children: 43mo and almost 4.  Mom died in 03-22-2013 of lung CA, Dad healthy, no siblings, Maternal GMA also had lung cancer Hobbies/Activities: Enjoys arts and crafts. Has had a hard time getting into hobbies/self-care since the birth of her second child.  Work: Works as a Cabin crew  Exercise: Has a home gym but exercise has fallen by the wayside with the birth of her second child.  Tobacco:   Does not smoke, does vape. Trying to quit. Smoked for 15 years ~1ppd. No using 2% vapes. Has patches at home. Plan is to pick a day and quit cold turkey--I approve of this plan.  etOH/drugs:  None. None recently, occasional THC gummy. Long time ago used Cocaine. 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  Sex: One partner, reports no need for STI testing at this time.    PDMP reviewed and reveals appropriate fill history of Adderall, Klonopin, and Suboxone.     OBJECTIVE:   BP 114/73   Pulse 79   Ht 5\' 6"  (1.676 m)   Wt  154 lb 8 oz (70.1 kg)   LMP  (LMP Unknown)   SpO2 98%   BMI 24.94 kg/m   ***  ASSESSMENT/PLAN:   No problem-specific Assessment & Plan notes found for this encounter.      Aimee Guarneri, MD [place]

## 2023-02-19 ENCOUNTER — Ambulatory Visit (INDEPENDENT_AMBULATORY_CARE_PROVIDER_SITE_OTHER): Payer: BC Managed Care – PPO | Admitting: Student

## 2023-02-19 ENCOUNTER — Encounter: Payer: Self-pay | Admitting: Student

## 2023-02-19 VITALS — BP 114/73 | HR 79 | Ht 66.0 in | Wt 154.5 lb

## 2023-02-19 DIAGNOSIS — Z7689 Persons encountering health services in other specified circumstances: Secondary | ICD-10-CM

## 2023-02-19 DIAGNOSIS — F1111 Opioid abuse, in remission: Secondary | ICD-10-CM

## 2023-02-19 NOTE — Patient Instructions (Addendum)
Graylin,  It is such a joy to meet you---I'm going to enjoy being your doctor!  Let's bring you back in 4 weeks or so. We will repeat your glucose tolerance test at that point.  I think 2-4mg  of Suboxone is a good place for [place] to stay. I'll refill this at the first of the month.  Your homework by next time I see you is: 1) Tell me what you've done for fun/self-care and 2) Tell me what you've been doing in your home gym (bonus points if you bring me pictures of your set up!)   J Pearla Dubonnet, MD

## 2023-02-21 DIAGNOSIS — Z7689 Persons encountering health services in other specified circumstances: Secondary | ICD-10-CM | POA: Insufficient documentation

## 2023-02-21 MED ORDER — BUPRENORPHINE HCL-NALOXONE HCL 4-1 MG SL FILM: 0.5000 | ORAL_FILM | Freq: Every day | SUBLINGUAL | 0 refills | Status: DC

## 2023-02-21 NOTE — Assessment & Plan Note (Signed)
Very pleasant, overall healthy 37year old. No immediate concerns based on health or family history. Strong family history of lung cancer, though thankfully she has already gotten over the largest hurdle here and quit smoking. We will work together on [place] cessation.  She has plans to re-establish with her neurologist who will continue managing her Klonopin and Adderall. I have updated her problem list and REMOVED Epilepsy based on our discussion and the chart documentation available to me.  I will be happy to assume prescribing of her suboxone. No cravings on current, relatively small dose. Hopeful we can work together and achieve her goal of coming off of this altogether in several months. Though certainly wouldn't rush this given that she's doing well.

## 2023-02-21 NOTE — Assessment & Plan Note (Signed)
Doing well. Taking 2-4mg  suboxone daily. No cravings. Desires coming off of this once >1 year postpartum. - Will refill at current dose for next month - Return in four weeks

## 2023-03-08 DIAGNOSIS — F988 Other specified behavioral and emotional disorders with onset usually occurring in childhood and adolescence: Secondary | ICD-10-CM | POA: Diagnosis not present

## 2023-03-29 ENCOUNTER — Other Ambulatory Visit: Payer: Self-pay

## 2023-03-29 ENCOUNTER — Ambulatory Visit: Payer: Self-pay | Admitting: Student

## 2023-03-29 ENCOUNTER — Encounter: Payer: Self-pay | Admitting: Student

## 2023-03-29 ENCOUNTER — Ambulatory Visit (INDEPENDENT_AMBULATORY_CARE_PROVIDER_SITE_OTHER): Payer: BC Managed Care – PPO | Admitting: Student

## 2023-03-29 DIAGNOSIS — F1111 Opioid abuse, in remission: Secondary | ICD-10-CM | POA: Diagnosis not present

## 2023-03-29 DIAGNOSIS — F419 Anxiety disorder, unspecified: Secondary | ICD-10-CM

## 2023-03-29 DIAGNOSIS — F909 Attention-deficit hyperactivity disorder, unspecified type: Secondary | ICD-10-CM

## 2023-03-29 MED ORDER — BUPRENORPHINE HCL-NALOXONE HCL 4-1 MG SL FILM: 0.5000 | ORAL_FILM | Freq: Every day | SUBLINGUAL | 0 refills | Status: AC

## 2023-03-29 NOTE — Patient Instructions (Addendum)
Aimee Guzman to see you! Glad to hear that you're getting outside and doing things for yourself!  Let's refill your Suboxone, I'll send a full 30 films to get [place] into the height of summer. Come back to see me around then and we can see if the stars are aligning to come off --- or not, that'd be fine too.   Eliezer Mccoy, MD

## 2023-03-29 NOTE — Progress Notes (Deleted)
    SUBJECTIVE:   CHIEF COMPLAINT / HPI:   Hx of opioid abuse on suboxone. Only using 0.5 film daily (2mg  buprenorphine). Finds this keeps cravings down. Eventually wants to get off altogether, but waiting until she is further postpartum.    Has established with neuro to take over Klonopin and Adderall?    PERTINENT  PMH / PSH: ***  OBJECTIVE:   There were no vitals taken for this visit.  ***  ASSESSMENT/PLAN:   No problem-specific Assessment & Plan notes found for this encounter.     Eliezer Mccoy, MD [facility]

## 2023-03-29 NOTE — Progress Notes (Unsigned)
SUBJECTIVE:   CHIEF COMPLAINT / HPI:   Postpartum Blues  Tired Coming up on a year postpartum.  She just does not have the count of energy that she would like.  This like this is getting better, though certainly not as quickly as she would like.  Had a small fatigue evaluation last July with a CBC and TSH which were both within normal limits.  Plans that has her youngest gets a bit older, she is able to get more uninterrupted sleep which seems to be helping.  She also enjoys getting outside and spending time in nature, going on walks with her family.  This helps to bolster her mood and her energy levels.  OUD in Remission--on Suboxone Stable on 2 mg buprenorphine daily.  Had some hiccups with the nonprescribed opioid use in the early months of this year after running into a small difference in experiencing some cravings but has been without cravings in recent weeks.  She is able to address her cravings by taking an extra half film of Suboxone when needed.  Ultimately, her goal is to come off of her Suboxone, she would like to perhaps try this after she processes the 1 year postpartum mark.  Anxiety  ADHD Previously managed by neurology. Dr Crissie Reese and I had taken over prescribing of her Adderall and Klonopin until she could get established with a new neurologist.  She has since had a televisit with a new neurologist and has an upcoming in person appointment next month.  Does not need refills at this time. Has expressed an interest in possibly transitioning to a noncontrolled substance for management of her anxiety.  We discussed SSRIs versus BuSpar as an option.  OBJECTIVE:   BP 115/71   Pulse 60   Ht 5\' 6"  (1.676 m)   Wt 153 lb 3.2 oz (69.5 kg)   SpO2 100%   BMI 24.73 kg/m   Physical Exam Constitutional:      General: She is not in acute distress.    Appearance: She is normal weight.  Cardiovascular:     Rate and Rhythm: Normal rate and regular rhythm.     Heart sounds: No murmur  heard. Pulmonary:     Effort: Pulmonary effort is normal. No respiratory distress.  Musculoskeletal:        General: No swelling or deformity.  Skin:    General: Skin is warm and dry.     Capillary Refill: Capillary refill takes less than 2 seconds.  Neurological:     Mental Status: She is alert.  Psychiatric:        Mood and Affect: Mood normal.        Behavior: Behavior normal.        Thought Content: Thought content normal.      ASSESSMENT/PLAN:   Postpartum follow-up I suspect her general tiredness is related to having 2 children under 4 at home and limited time to care for herself.  She is working on building and more time with her children spending time outside going on family walks to get some exercise.  She recognizes that exercise is helpful for boosting her energy levels.  She has a home gym at home and is working on getting into a better routine here.  She is not interested in medication at this time, we are both hopeful that she will begin to turn the corner with the changing of the seasons.  Will have her back in 2 months as we get into  the height of summer to see how she is doing.  Opioid use disorder, mild, in sustained remission (HCC) Doing well, without cravings.  Only on a half a film per day.  2 mg of buprenorphine.  Hoping to discontinue after she passes the 1 year postpartum mark.  Will discuss this further at her next visit, but I think this is certainly a possibility for her and expect that she would do well. -I have refilled her Suboxone at the current dose of 2-4 mg of buprenorphine daily, though she very seldom needs the full 4 mg  Anxiety Is establish with a new neurologist, the plan is for them to assume prescribing of her Klonopin.  Will continue open lines of communication around transitioning to another agent.  Would favor SSRI +/- buspirone.  ADHD Prescription of her Adderall to be assumed by a new neurologist.  Has already had a televisit with them.   Has a new patient in person visit in June.     Eliezer Mccoy, MD [facility]

## 2023-03-31 NOTE — Assessment & Plan Note (Signed)
Prescription of her Adderall to be assumed by a new neurologist.  Has already had a televisit with them.  Has a new patient in person visit in June.

## 2023-03-31 NOTE — Assessment & Plan Note (Signed)
Doing well, without cravings.  Only on a half a film per day.  2 mg of buprenorphine.  Hoping to discontinue after she passes the 1 year postpartum mark.  Will discuss this further at her next visit, but I think this is certainly a possibility for her and expect that she would do well. -I have refilled her Suboxone at the current dose of 2-4 mg of buprenorphine daily, though she very seldom needs the full 4 mg

## 2023-03-31 NOTE — Assessment & Plan Note (Addendum)
I suspect her general tiredness is related to having 2 children under 4 at home and limited time to care for herself.  She is working on building and more time with her children spending time outside going on family walks to get some exercise.  She recognizes that exercise is helpful for boosting her energy levels.  She has a home gym at home and is working on getting into a better routine here.  She is not interested in medication at this time, we are both hopeful that she will begin to turn the corner with the changing of the seasons.  Will have her back in 2 months as we get into the height of summer to see how she is doing.

## 2023-03-31 NOTE — Assessment & Plan Note (Signed)
Is establish with a new neurologist, the plan is for them to assume prescribing of her Klonopin.  Will continue open lines of communication around transitioning to another agent.  Would favor SSRI +/- buspirone.

## 2023-04-05 ENCOUNTER — Encounter: Payer: Self-pay | Admitting: Student

## 2023-04-22 ENCOUNTER — Telehealth: Payer: BC Managed Care – PPO | Admitting: Physician Assistant

## 2023-04-22 DIAGNOSIS — A084 Viral intestinal infection, unspecified: Secondary | ICD-10-CM

## 2023-04-22 MED ORDER — ONDANSETRON 4 MG PO TBDP: 4.0000 mg | ORAL_TABLET | Freq: Three times a day (TID) | ORAL | 0 refills | Status: DC | PRN

## 2023-04-22 NOTE — Patient Instructions (Signed)
  Aimee Guzman, thank you for joining Aimee Climes, PA-C for today's virtual visit.  While this provider is not your primary care provider (PCP), if your PCP is located in our provider database this encounter information will be shared with them immediately following your visit.   A [facility] MyChart account gives you access to today's visit and all your visits, tests, and labs performed at [facility] " click here if you don't have a [facility] MyChart account or go to [email or web address]  Consent: (Patient) Aimee Guzman provided verbal consent for this virtual visit at the beginning of the encounter.  Current Medications:  Current Outpatient Medications:    amphetamine-dextroamphetamine (ADDERALL) 20 MG tablet, Take 1 tablet (20 mg total) by mouth 2 (two) times daily., Disp: 60 tablet, Rfl: 0   Buprenorphine HCl-Naloxone HCl (SUBOXONE) 4-1 MG FILM, Place 0.5 Film under the tongue daily., Disp: 30 each, Rfl: 0   clonazePAM (KLONOPIN) 0.5 MG tablet, Take 1 tablet (0.5 mg total) by mouth 2 (two) times daily as needed for anxiety., Disp: 60 tablet, Rfl: 0   cyanocobalamin (VITAMIN B12) 1000 MCG tablet, Take 1,000 mcg by mouth daily., Disp: , Rfl:    ibuprofen (ADVIL) 800 MG tablet, Take 1 tablet (800 mg total) by mouth every 8 (eight) hours as needed., Disp: 60 tablet, Rfl: 0   levonorgestrel (LILETTA, 52 MG,) 20.1 MCG/DAY IUD IUD, 1 each by Intrauterine route once., Disp: , Rfl:    Medications ordered in this encounter:  No orders of the defined types were placed in this encounter.    *If you need refills on other medications prior to your next appointment, please contact your pharmacy*  Follow-Up: Call back or seek an in-person evaluation if the symptoms worsen or if the condition fails to improve as anticipated.  [facility] [phone]  Other Instructions Please keep well-hydrated and try to get plenty of rest. Follow the dietary  recommendations below. Okay to use over-the-counter Imodium if needed for recurrence of frequent stools. The Zofran I prescribed is to be used as directed to help with nausea and prevent vomiting. Symptoms should ease up over the next couple of days. If symptoms are not resolving or you note worsening symptoms despite treatment, or if you are unable to keep any fluids in your system, you need to seek an in person evaluation ASAP.  Do not delay care!   If you have been instructed to have an in-person evaluation today at a local Urgent Care facility, please use the link below. It will take you to a list of all of our available [facility], including address, phone number and hours of operation. Please do not delay care.  [facility]  If you or a family member do not have a primary care provider, use the link below to schedule a visit and establish care. When you choose a [facility] primary care physician or advanced practice provider, you gain a long-term partner in health. Find a Primary Care Provider  Learn more about [place]'s in-office and virtual care options: [facility] - Get Care Now

## 2023-04-22 NOTE — Progress Notes (Signed)
Virtual Visit Consent   Aimee Guzman, you are scheduled for a virtual visit with a [facility] provider today. Just as with appointments in the office, your consent must be obtained to participate. Your consent will be active for this visit and any virtual visit you may have with one of our providers in the next 365 days. If you have a MyChart account, a copy of this consent can be sent to you electronically.  As this is a virtual visit, video technology does not allow for your provider to perform a traditional examination. This may limit your provider's ability to fully assess your condition. If your provider identifies any concerns that need to be evaluated in person or the need to arrange testing (such as labs, EKG, etc.), we will make arrangements to do so. Although advances in technology are sophisticated, we cannot ensure that it will always work on either your end or our end. If the connection with a video visit is poor, the visit may have to be switched to a telephone visit. With either a video or telephone visit, we are not always able to ensure that we have a secure connection.  By engaging in this virtual visit, you consent to the provision of healthcare and authorize for your insurance to be billed (if applicable) for the services provided during this visit. Depending on your insurance coverage, you may receive a charge related to this service.  I need to obtain your verbal consent now. Are you willing to proceed with your visit today? Aimee Guzman has provided verbal consent on 04/22/2023 for a virtual visit (video or telephone). Piedad Climes, [place]  Date: 04/22/2023 4:50 PM  Virtual Visit via Video Note   I, Piedad Climes, connected with  Aimee Guzman  ([id number], [birth year]) on 04/22/23 at  4:45 PM EDT by a video-enabled telemedicine application and verified that I am speaking with the correct person using two identifiers.  Location: Patient: Virtual Visit  Location Patient: Mobile Provider: Virtual Visit Location Provider: Home Office   I discussed the limitations of evaluation and management by telemedicine and the availability of in person appointments. The patient expressed understanding and agreed to proceed.    History of Present Illness: Aimee Guzman is a 37 y.o. who identifies as a female who was assigned female at birth, and is being seen today for nausea and vomiting starting this morning around 6 AM. Denies fever, chills. Notes about 10 episodes of non-bloody emesis since this AM. Is sipping water. Notes anorexia so not eating much today. Also noting abdominal cramping and some loose stool but loose stool was this morning only. Denies recent travel. Notes this past weekend she was around her father who has developed similar symptoms. Daughter with similar symptoms.  OTC -- Pepto Bismol to settle stomach.   HPI: HPI  Problems:  Patient Active Problem List   Diagnosis Date Noted   Postpartum follow-up 03/31/2023   IUD (intrauterine device) in place 09/08/2022   Abnormal Pap smear of cervix 06/12/2022   Lyme disease 06/12/2022   Gestational diabetes mellitus (GDM) 02/02/2022   History of hepatitis C 02/02/2022   ADHD 01/22/2022   Anxiety 01/22/2022   Iron deficiency anemia secondary to inadequate dietary iron intake 04/10/2019   Gestational thrombocytopenia (HCC) 04/10/2019   Cocaine abuse complicating pregnancy (HCC) 12/30/2018   Opioid use disorder, mild, in sustained remission (HCC) 12/30/2018   Genital herpes simplex 12/26/2018    Allergies: No Known Allergies Medications:  Current Outpatient Medications:    ondansetron (ZOFRAN-ODT) 4 MG disintegrating tablet, Take 1 tablet (4 mg total) by mouth every 8 (eight) hours as needed for nausea or vomiting., Disp: 20 tablet, Rfl: 0   amphetamine-dextroamphetamine (ADDERALL) 20 MG tablet, Take 1 tablet (20 mg total) by mouth 2 (two) times daily., Disp: 60 tablet, Rfl: 0    Buprenorphine HCl-Naloxone HCl (SUBOXONE) 4-1 MG FILM, Place 0.5 Film under the tongue daily., Disp: 30 each, Rfl: 0   clonazePAM (KLONOPIN) 0.5 MG tablet, Take 1 tablet (0.5 mg total) by mouth 2 (two) times daily as needed for anxiety., Disp: 60 tablet, Rfl: 0   cyanocobalamin (VITAMIN B12) 1000 MCG tablet, Take 1,000 mcg by mouth daily., Disp: , Rfl:    ibuprofen (ADVIL) 800 MG tablet, Take 1 tablet (800 mg total) by mouth every 8 (eight) hours as needed., Disp: 60 tablet, Rfl: 0   levonorgestrel (LILETTA, 52 MG,) 20.1 MCG/DAY IUD IUD, 1 each by Intrauterine route once., Disp: , Rfl:   Observations/Objective: Patient is well-developed, well-nourished in no acute distress.  Resting comfortably at home.  Head is normocephalic, atraumatic.  No labored breathing. Speech is clear and coherent with logical content.  Patient is alert and oriented at baseline.   Assessment and Plan: 1. Viral gastroenteritis - ondansetron (ZOFRAN-ODT) 4 MG disintegrating tablet; Take 1 tablet (4 mg total) by mouth every 8 (eight) hours as needed for nausea or vomiting.  Dispense: 20 tablet; Refill: 0  Afebrile.  No alarm signs or symptoms.  Is tolerating sips of fluids now.  Hopefully her body is starting to get a control over this already.  Supportive measures and OTC medications reviewed including use of Imodium in case of recurrent frequent loose stool.  Brat diet reviewed.  Handout sent to the MyChart via patient's AVS.  Zofran per orders to calm nausea and vomiting.  Strict ER precautions reviewed with patient who voiced understanding and agreement with the plan.  Follow Up Instructions: I discussed the assessment and treatment plan with the patient. The patient was provided an opportunity to ask questions and all were answered. The patient agreed with the plan and demonstrated an understanding of the instructions.  A copy of instructions were sent to the patient via MyChart unless otherwise noted below.   The  patient was advised to call back or seek an in-person evaluation if the symptoms worsen or if the condition fails to improve as anticipated.  Time:  I spent 10 minutes with the patient via telehealth technology discussing the above problems/concerns.    Piedad Climes, PA-C

## 2023-05-06 DIAGNOSIS — F419 Anxiety disorder, unspecified: Secondary | ICD-10-CM | POA: Diagnosis not present

## 2023-05-14 ENCOUNTER — Encounter: Payer: Self-pay | Admitting: Student

## 2023-05-14 DIAGNOSIS — F1111 Opioid abuse, in remission: Secondary | ICD-10-CM

## 2023-05-14 MED ORDER — BUPRENORPHINE HCL-NALOXONE HCL 4-1 MG SL FILM: 2.0000 mg | ORAL_FILM | Freq: Every day | SUBLINGUAL | 0 refills | Status: DC

## 2023-05-25 ENCOUNTER — Ambulatory Visit: Payer: Self-pay | Admitting: Student

## 2023-06-07 ENCOUNTER — Ambulatory Visit (INDEPENDENT_AMBULATORY_CARE_PROVIDER_SITE_OTHER): Payer: BC Managed Care – PPO | Admitting: Student

## 2023-06-07 ENCOUNTER — Encounter: Payer: Self-pay | Admitting: Student

## 2023-06-07 ENCOUNTER — Other Ambulatory Visit (HOSPITAL_COMMUNITY)
Admission: RE | Admit: 2023-06-07 | Discharge: 2023-06-07 | Disposition: A | Discharge status: Home / Self Care | Payer: BC Managed Care – PPO | Source: Ambulatory Visit | Attending: Family Medicine | Admitting: Family Medicine

## 2023-06-07 ENCOUNTER — Other Ambulatory Visit: Payer: Self-pay

## 2023-06-07 VITALS — BP 113/72 | HR 77 | Ht 66.0 in | Wt 146.8 lb

## 2023-06-07 DIAGNOSIS — Z124 Encounter for screening for malignant neoplasm of cervix: Secondary | ICD-10-CM | POA: Insufficient documentation

## 2023-06-07 DIAGNOSIS — R87610 Atypical squamous cells of undetermined significance on cytologic smear of cervix (ASC-US): Secondary | ICD-10-CM

## 2023-06-07 DIAGNOSIS — F909 Attention-deficit hyperactivity disorder, unspecified type: Secondary | ICD-10-CM

## 2023-06-07 DIAGNOSIS — F419 Anxiety disorder, unspecified: Secondary | ICD-10-CM

## 2023-06-07 DIAGNOSIS — F1111 Opioid abuse, in remission: Secondary | ICD-10-CM

## 2023-06-07 MED ORDER — AMPHETAMINE-DEXTROAMPHETAMINE 20 MG PO TABS: 20.0000 mg | ORAL_TABLET | Freq: Two times a day (BID) | ORAL | 0 refills | Status: DC

## 2023-06-07 NOTE — Progress Notes (Signed)
    SUBJECTIVE:   CHIEF COMPLAINT / HPI:   History of Opioid Use Disorder Presently using just 2mg  Suboxone Daily.  She is now about 18 months out from her most recent regular drug use.  She is feeling motivated to try coming off of the Suboxone.  She does not have any issues with unprovoked cravings at this time. She feels that the primary danger to her sobriety is running into people and public, such as in the grocery store, who she used to buy from or use with.  Mood Much better with the summer season. Back in the gym, and getting steps in.  Really feeling quite good.  Was previously on Klonopin for anxiety symptoms but has come off of this for the past several months and is doing quite well.  ADHD In the past, her Adderall has been managed by her neurologist, Dr. Estella Husk.  Unfortunately, he is in the process of changing practices and she has not yet been able to establish with them in his new clinic.  This is resulted in a lapse in her Adderall prescriptions.  She does not see neurology for any other issues.  When offered, she is amenable to transitioning her Adderall prescribing here to our practice.  Health Maintenance  Due for Pap today. Last Pap was 1 year ago with ASCUS with neg high risk HPV. Hx Leep in 2005.    OBJECTIVE:   BP 113/72   Pulse 77   Ht 5\' 6"  (1.676 m)   Wt 146 lb 12.8 oz (66.6 kg)   SpO2 100%   BMI 23.69 kg/m   Physical Exam Vitals reviewed. Exam conducted with a chaperone present.  Constitutional:      General: She is not in acute distress. HENT:     Mouth/Throat:     Mouth: Mucous membranes are moist.  Pulmonary:     Effort: No respiratory distress.  Genitourinary:    General: Normal vulva.     Vagina: Normal.     Comments: IUD strings visualized and in place. Cervical ectropion is present, non-friable.  Neurological:     Mental Status: She is alert.      ASSESSMENT/PLAN:   Opioid use disorder, mild, in sustained remission [facility]) Doing quite  well! Will trial off Suboxone per patient request. She will reach out via MyChart should cravings return or she desire to re-start suboxone.   Anxiety Doing very well off of Klonopin. - Will remove from med list  - Continue exercise interventions   ADHD Has been stable on present dose of Adderall for quite some time. I am happy to assume prescribing this.  - Refilled Adderall 20mg  BID   Abnormal Pap smear of cervix Hx Leep in 2005. ASCUS neg HPV 1 year ago. -Repeat Pap today, declines STI testing      J Dorothyann Gibbs, MD [facility]

## 2023-06-07 NOTE — Patient Instructions (Addendum)
I'm so so glad to hear that things are going well. I think an attempt to come off of the suboxone at this point makes good sense, please do let me know if cravings creep up, old people come back into your life, or anything else triggers you and we need to re-start.   I also think being off Klonopin is for the better, and Im glad to hear that!   I am happy to take over your Adderall prescription so you don't have to drive all the way to [facility] for neurology.   I will shoot you a MyChart message if your Pap is normal, otherwise I will call you  J Dorothyann Gibbs, MD

## 2023-06-09 LAB — CYTOLOGY - PAP
Comment: NEGATIVE
Diagnosis: NEGATIVE
Diagnosis: REACTIVE
High risk HPV: NEGATIVE

## 2023-06-09 NOTE — Assessment & Plan Note (Signed)
Doing very well off of Klonopin. - Will remove from med list  - Continue exercise interventions

## 2023-06-09 NOTE — Assessment & Plan Note (Signed)
Doing quite well! Will trial off Suboxone per patient request. She will reach out via MyChart should cravings return or she desire to re-start suboxone.

## 2023-06-09 NOTE — Assessment & Plan Note (Signed)
Has been stable on present dose of Adderall for quite some time. I am happy to assume prescribing this.  - Refilled Adderall 20mg  BID

## 2023-06-09 NOTE — Assessment & Plan Note (Addendum)
Hx Leep in 2005. ASCUS neg HPV 1 year ago. -Repeat Pap today, declines STI testing

## 2023-07-06 ENCOUNTER — Encounter: Payer: Self-pay | Admitting: Student

## 2023-07-06 ENCOUNTER — Other Ambulatory Visit: Payer: Self-pay | Admitting: Student

## 2023-07-06 DIAGNOSIS — F909 Attention-deficit hyperactivity disorder, unspecified type: Secondary | ICD-10-CM

## 2023-07-06 MED ORDER — AMPHETAMINE-DEXTROAMPHETAMINE 20 MG PO TABS: 20.0000 mg | ORAL_TABLET | Freq: Two times a day (BID) | ORAL | 0 refills | Status: DC

## 2023-07-20 ENCOUNTER — Encounter: Payer: Self-pay | Admitting: Student

## 2023-07-20 DIAGNOSIS — F1111 Opioid abuse, in remission: Secondary | ICD-10-CM

## 2023-07-20 MED ORDER — BUPRENORPHINE HCL-NALOXONE HCL 4-1 MG SL FILM: 2.0000 mg | ORAL_FILM | Freq: Every day | SUBLINGUAL | 0 refills | Status: DC

## 2023-08-09 ENCOUNTER — Other Ambulatory Visit: Payer: Self-pay | Admitting: Student

## 2023-08-09 DIAGNOSIS — F909 Attention-deficit hyperactivity disorder, unspecified type: Secondary | ICD-10-CM

## 2023-08-10 ENCOUNTER — Encounter: Payer: Self-pay | Admitting: Student

## 2023-08-11 MED ORDER — AMPHETAMINE-DEXTROAMPHETAMINE 20 MG PO TABS: 20.0000 mg | ORAL_TABLET | Freq: Two times a day (BID) | ORAL | 0 refills | Status: DC

## 2023-08-12 DIAGNOSIS — L03114 Cellulitis of left upper limb: Secondary | ICD-10-CM | POA: Diagnosis not present

## 2023-08-12 DIAGNOSIS — J029 Acute pharyngitis, unspecified: Secondary | ICD-10-CM | POA: Diagnosis not present

## 2023-09-10 ENCOUNTER — Other Ambulatory Visit: Payer: Self-pay | Admitting: Student

## 2023-09-10 DIAGNOSIS — F909 Attention-deficit hyperactivity disorder, unspecified type: Secondary | ICD-10-CM

## 2023-09-10 MED ORDER — AMPHETAMINE-DEXTROAMPHETAMINE 20 MG PO TABS: 20.0000 mg | ORAL_TABLET | Freq: Two times a day (BID) | ORAL | 0 refills | Status: DC

## 2023-10-06 ENCOUNTER — Other Ambulatory Visit: Payer: Self-pay | Admitting: Student

## 2023-10-06 DIAGNOSIS — F1111 Opioid abuse, in remission: Secondary | ICD-10-CM

## 2023-10-07 ENCOUNTER — Encounter: Payer: Self-pay | Admitting: Student

## 2023-10-07 ENCOUNTER — Encounter: Payer: Self-pay | Admitting: Family Medicine

## 2023-10-07 MED ORDER — BUPRENORPHINE HCL-NALOXONE HCL 4-1 MG SL FILM
2.0000 mg | ORAL_FILM | Freq: Every day | SUBLINGUAL | 0 refills | Status: DC
Start: 2023-10-07 — End: 2023-12-17

## 2023-10-11 ENCOUNTER — Other Ambulatory Visit: Payer: Self-pay | Admitting: Student

## 2023-10-11 DIAGNOSIS — F909 Attention-deficit hyperactivity disorder, unspecified type: Secondary | ICD-10-CM

## 2023-10-11 MED ORDER — AMPHETAMINE-DEXTROAMPHETAMINE 20 MG PO TABS: 20.0000 mg | ORAL_TABLET | Freq: Two times a day (BID) | ORAL | 0 refills | Status: DC

## 2023-11-09 ENCOUNTER — Other Ambulatory Visit: Payer: Self-pay | Admitting: Student

## 2023-11-09 DIAGNOSIS — F909 Attention-deficit hyperactivity disorder, unspecified type: Secondary | ICD-10-CM

## 2023-11-11 ENCOUNTER — Encounter: Payer: Self-pay | Admitting: Student

## 2023-11-12 MED ORDER — AMPHETAMINE-DEXTROAMPHETAMINE 20 MG PO TABS: 20.0000 mg | ORAL_TABLET | Freq: Two times a day (BID) | ORAL | 0 refills | Status: DC

## 2023-12-11 ENCOUNTER — Other Ambulatory Visit: Payer: Self-pay | Admitting: Student

## 2023-12-11 DIAGNOSIS — F909 Attention-deficit hyperactivity disorder, unspecified type: Secondary | ICD-10-CM

## 2023-12-13 DIAGNOSIS — M25531 Pain in right wrist: Secondary | ICD-10-CM | POA: Diagnosis not present

## 2023-12-13 DIAGNOSIS — M654 Radial styloid tenosynovitis [de Quervain]: Secondary | ICD-10-CM | POA: Diagnosis not present

## 2023-12-14 MED ORDER — AMPHETAMINE-DEXTROAMPHETAMINE 20 MG PO TABS: 20.0000 mg | ORAL_TABLET | Freq: Two times a day (BID) | ORAL | 0 refills | Status: DC

## 2023-12-17 ENCOUNTER — Other Ambulatory Visit: Payer: Self-pay | Admitting: Student

## 2023-12-17 DIAGNOSIS — F1111 Opioid abuse, in remission: Secondary | ICD-10-CM

## 2023-12-20 MED ORDER — BUPRENORPHINE HCL-NALOXONE HCL 4-1 MG SL FILM: 2.0000 mg | ORAL_FILM | Freq: Every day | SUBLINGUAL | 0 refills | Status: DC

## 2023-12-29 DIAGNOSIS — M25531 Pain in right wrist: Secondary | ICD-10-CM | POA: Diagnosis not present

## 2024-01-10 DIAGNOSIS — M654 Radial styloid tenosynovitis [de Quervain]: Secondary | ICD-10-CM | POA: Diagnosis not present

## 2024-01-12 ENCOUNTER — Other Ambulatory Visit: Payer: Self-pay | Admitting: Student

## 2024-01-12 DIAGNOSIS — F909 Attention-deficit hyperactivity disorder, unspecified type: Secondary | ICD-10-CM

## 2024-01-13 MED ORDER — AMPHETAMINE-DEXTROAMPHETAMINE 20 MG PO TABS: 20.0000 mg | ORAL_TABLET | Freq: Two times a day (BID) | ORAL | 0 refills | Status: DC

## 2024-01-26 DIAGNOSIS — H5015 Alternating exotropia: Secondary | ICD-10-CM | POA: Diagnosis not present

## 2024-01-26 DIAGNOSIS — Z9889 Other specified postprocedural states: Secondary | ICD-10-CM | POA: Diagnosis not present

## 2024-02-08 ENCOUNTER — Other Ambulatory Visit: Payer: Self-pay | Admitting: Student

## 2024-02-08 ENCOUNTER — Telehealth: Payer: Self-pay | Admitting: Student

## 2024-02-08 DIAGNOSIS — F909 Attention-deficit hyperactivity disorder, unspecified type: Secondary | ICD-10-CM

## 2024-02-08 MED ORDER — AMPHETAMINE-DEXTROAMPHETAMINE 20 MG PO TABS: 20.0000 mg | ORAL_TABLET | Freq: Two times a day (BID) | ORAL | 0 refills | Status: DC

## 2024-02-08 NOTE — Telephone Encounter (Signed)
 Patient on both chronic Adderall and Suboxone. Not seen in person since 05/2023. Have asked her to return for in-person visit. Adderall refilled today, needs visit before next fill. She is scheduled with me on Friday 3/14.   Eliezer Mccoy, MD

## 2024-02-11 ENCOUNTER — Ambulatory Visit (INDEPENDENT_AMBULATORY_CARE_PROVIDER_SITE_OTHER): Payer: Self-pay | Admitting: Student

## 2024-02-11 VITALS — BP 124/76 | HR 76 | Ht 66.0 in | Wt 147.6 lb

## 2024-02-11 DIAGNOSIS — F1111 Opioid abuse, in remission: Secondary | ICD-10-CM | POA: Diagnosis not present

## 2024-02-11 MED ORDER — BUPRENORPHINE HCL-NALOXONE HCL 4-1 MG SL FILM: 2.0000 mg | ORAL_FILM | Freq: Every day | SUBLINGUAL | 0 refills | Status: DC

## 2024-02-11 NOTE — Patient Instructions (Signed)
 Annalaya,  Always great to see you! I'll make sure we get you passed over to an excellent next doctor.   Be well, Eliezer Mccoy, MD

## 2024-02-11 NOTE — Progress Notes (Signed)
    SUBJECTIVE:   CHIEF COMPLAINT / HPI:   Opioid Use Disorder Follow-up She continues to take Suboxone at a dose of 2 mg. She attempted to discontinue the medication but experienced cravings, prompting her to resume it. Winter is a more challenging season for her, whereas summer allows her to be outside and lifts her spirits.  She is on Adderall for ADHD and reports no concerns with her current dose, which she has been on for a long time. She is no longer taking Klonopin and this has been removed from her medication list.  In January, she experienced a fall at church, during which she slipped and thought she had broken her scaphoid bone in her wrist. She underwent multiple x-rays, but it was confirmed that there was no fracture. She felt that the suboxone helped a bit with pain management here.   There are no issues with other drug use, and she is due for a urine drug screen as it has been a year since the last one.   OBJECTIVE:   BP 124/76   Pulse 76   Ht 5\' 6"  (1.676 m)   Wt 147 lb 9.6 oz (67 kg)   SpO2 99%   BMI 23.82 kg/m   General: alert & oriented, no apparent distress, well groomed HEENT: normocephalic, atraumatic, EOM grossly intact, oral mucosa moist, neck supple Respiratory: normal respiratory effort GI: non-distended Skin: no rashes, no jaundice Psych: appropriate mood and affect   ASSESSMENT/PLAN:   Assessment & Plan Opioid use disorder, mild, in sustained remission (HCC) Doing well on current dose of Suboxone. She may want to discontinue this in the future, but we will stay the course for now as benefits would seem to outweigh risks. Has been 1 year since last UDS.  - Refill provided - UDS today    J Dorothyann Gibbs, MD [facility]

## 2024-02-11 NOTE — Assessment & Plan Note (Signed)
 Doing well on current dose of Suboxone. She may want to discontinue this in the future, but we will stay the course for now as benefits would seem to outweigh risks. Has been 1 year since last UDS.  - Refill provided - UDS today

## 2024-02-14 ENCOUNTER — Other Ambulatory Visit: Payer: Self-pay | Admitting: Student

## 2024-02-14 ENCOUNTER — Telehealth: Payer: Self-pay | Admitting: *Deleted

## 2024-02-14 DIAGNOSIS — F1111 Opioid abuse, in remission: Secondary | ICD-10-CM

## 2024-02-14 NOTE — Telephone Encounter (Signed)
 Pt scheduled for lab appt Wednesday. Aimee Guzman, CMA

## 2024-02-14 NOTE — Progress Notes (Signed)
 Did not leave UDS at last visit, will have red hall call to come in and have done

## 2024-02-14 NOTE — Telephone Encounter (Signed)
-----   Message from [employer] sent at 02/14/2024  8:52 AM EDT ----- Regarding: FW: Urine Drug Screen Patient needs to come in for UDS ----- Message ----- From: Sunday Spillers, CMA Sent: 02/11/2024   4:50 PM EDT To: Alicia Amel, MD Subject: Urine Drug Screen                              FYI Pt did not leave urine to be tested.  Aimee Guzman

## 2024-02-16 ENCOUNTER — Other Ambulatory Visit

## 2024-03-12 DIAGNOSIS — J302 Other seasonal allergic rhinitis: Secondary | ICD-10-CM | POA: Diagnosis not present

## 2024-03-12 DIAGNOSIS — J0110 Acute frontal sinusitis, unspecified: Secondary | ICD-10-CM | POA: Diagnosis not present

## 2024-03-12 DIAGNOSIS — R0981 Nasal congestion: Secondary | ICD-10-CM | POA: Diagnosis not present

## 2024-03-12 DIAGNOSIS — R051 Acute cough: Secondary | ICD-10-CM | POA: Diagnosis not present

## 2024-03-13 ENCOUNTER — Other Ambulatory Visit: Payer: Self-pay | Admitting: Student

## 2024-03-13 DIAGNOSIS — F909 Attention-deficit hyperactivity disorder, unspecified type: Secondary | ICD-10-CM

## 2024-03-14 MED ORDER — AMPHETAMINE-DEXTROAMPHETAMINE 20 MG PO TABS: 20.0000 mg | ORAL_TABLET | Freq: Two times a day (BID) | ORAL | 0 refills | Status: DC

## 2024-04-11 ENCOUNTER — Other Ambulatory Visit: Payer: Self-pay | Admitting: Student

## 2024-04-11 DIAGNOSIS — F909 Attention-deficit hyperactivity disorder, unspecified type: Secondary | ICD-10-CM

## 2024-04-12 DIAGNOSIS — R051 Acute cough: Secondary | ICD-10-CM | POA: Diagnosis not present

## 2024-04-12 DIAGNOSIS — R0981 Nasal congestion: Secondary | ICD-10-CM | POA: Diagnosis not present

## 2024-04-12 DIAGNOSIS — R0982 Postnasal drip: Secondary | ICD-10-CM | POA: Diagnosis not present

## 2024-04-12 DIAGNOSIS — J019 Acute sinusitis, unspecified: Secondary | ICD-10-CM | POA: Diagnosis not present

## 2024-04-12 MED ORDER — AMPHETAMINE-DEXTROAMPHETAMINE 20 MG PO TABS: 20.0000 mg | ORAL_TABLET | Freq: Two times a day (BID) | ORAL | 0 refills | Status: DC

## 2024-04-28 DIAGNOSIS — H5010 Unspecified exotropia: Secondary | ICD-10-CM | POA: Diagnosis not present

## 2024-04-28 DIAGNOSIS — H538 Other visual disturbances: Secondary | ICD-10-CM | POA: Diagnosis not present

## 2024-05-01 ENCOUNTER — Telehealth: Payer: Self-pay

## 2024-05-01 NOTE — Telephone Encounter (Signed)
 Patient calls nurse line requesting to speak with PCP.   She reports she would like to transition Suboxone  to Vivitrol  injections.  She reports she was doing monthly injections for ~ 7 months after leaving treatment facility. She reports she discussed with her family and they feel more confident in her sobriety with these injections.   Advised will forward to PCP.

## 2024-05-02 NOTE — Telephone Encounter (Signed)
 Patient scheduled for 6/6 with [facility].

## 2024-05-05 ENCOUNTER — Ambulatory Visit (INDEPENDENT_AMBULATORY_CARE_PROVIDER_SITE_OTHER): Admitting: Student

## 2024-05-05 ENCOUNTER — Other Ambulatory Visit (HOSPITAL_COMMUNITY)
Admission: RE | Admit: 2024-05-05 | Discharge: 2024-05-05 | Disposition: A | Discharge status: Home / Self Care | Source: Ambulatory Visit | Attending: Family Medicine | Admitting: Family Medicine

## 2024-05-05 VITALS — BP 120/79 | HR 81 | Ht 66.0 in | Wt 149.4 lb

## 2024-05-05 DIAGNOSIS — Z124 Encounter for screening for malignant neoplasm of cervix: Secondary | ICD-10-CM | POA: Insufficient documentation

## 2024-05-05 DIAGNOSIS — F909 Attention-deficit hyperactivity disorder, unspecified type: Secondary | ICD-10-CM | POA: Diagnosis not present

## 2024-05-05 DIAGNOSIS — F1111 Opioid abuse, in remission: Secondary | ICD-10-CM

## 2024-05-05 MED ORDER — AMPHETAMINE-DEXTROAMPHETAMINE 20 MG PO TABS: 20.0000 mg | ORAL_TABLET | Freq: Two times a day (BID) | ORAL | 0 refills | Status: DC

## 2024-05-05 MED ORDER — BUPRENORPHINE HCL-NALOXONE HCL 4-1 MG SL FILM
2.0000 mg | ORAL_FILM | Freq: Every day | SUBLINGUAL | 0 refills | Status: DC
Start: 2024-05-05 — End: 2024-08-02

## 2024-05-05 NOTE — Patient Instructions (Signed)
 Aimee Guzman,  Always great to see you! Let's stick with the suboxone  through the summer and we can then re-address things come August or so. Though I am leaving, you will remain on the patient rolls here unless you decide to continue your care elsewhere. If you do desire to see a psychiatrist here locally for Vivitrol , just shoot me a MyChart message and I will make that referral happen.  Alexa Andrews, MD
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
## 2024-05-06 NOTE — Assessment & Plan Note (Signed)
 Stable on current Adderall dose for quite some time.  - ToxAssure as above - Refill ordered to be filled on schedule on 05/15/24

## 2024-05-06 NOTE — Assessment & Plan Note (Signed)
 Doing quite well. Was able to develop a plan to safely and reasonably take narcotics for pain control after her surgery earlier this month. Transition to vivitrol  seems like a reasonable choice, though we also discussed that there is no shame in continuing on Suboxone  as it is working well. She is going to take the next few months to look into local providers who may be able to offer vivitrol .  - Refilling Suboxone  for now - Due for ToxAssure

## 2024-05-09 ENCOUNTER — Encounter: Payer: Self-pay | Admitting: *Deleted

## 2024-05-11 LAB — CYTOLOGY - PAP
Comment: NEGATIVE
Diagnosis: NEGATIVE
High risk HPV: NEGATIVE

## 2024-05-11 LAB — TOXASSURE SELECT 13 (MW), URINE

## 2024-05-12 ENCOUNTER — Ambulatory Visit: Payer: Self-pay | Admitting: Student

## 2024-05-12 DIAGNOSIS — F1111 Opioid abuse, in remission: Secondary | ICD-10-CM

## 2024-06-09 ENCOUNTER — Telehealth: Payer: Self-pay

## 2024-06-09 ENCOUNTER — Other Ambulatory Visit

## 2024-06-09 DIAGNOSIS — F1111 Opioid abuse, in remission: Secondary | ICD-10-CM

## 2024-06-09 DIAGNOSIS — F909 Attention-deficit hyperactivity disorder, unspecified type: Secondary | ICD-10-CM

## 2024-06-09 MED ORDER — AMPHETAMINE-DEXTROAMPHETAMINE 20 MG PO TABS: 20.0000 mg | ORAL_TABLET | Freq: Two times a day (BID) | ORAL | 0 refills | Status: DC

## 2024-06-09 NOTE — Telephone Encounter (Signed)
 Patient came in stating that she needs a refill on her Adderall please

## 2024-06-12 NOTE — Telephone Encounter (Signed)
 Patient informed. Penni Bombard CMA

## 2024-06-14 LAB — TOXASSURE SELECT 13 (MW), URINE

## 2024-06-22 DIAGNOSIS — Z9889 Other specified postprocedural states: Secondary | ICD-10-CM | POA: Diagnosis not present

## 2024-06-22 DIAGNOSIS — H5015 Alternating exotropia: Secondary | ICD-10-CM | POA: Diagnosis not present

## 2024-07-03 ENCOUNTER — Ambulatory Visit

## 2024-07-03 VITALS — BP 104/71 | HR 76 | Ht 66.0 in | Wt 148.4 lb

## 2024-07-03 DIAGNOSIS — F1111 Opioid abuse, in remission: Secondary | ICD-10-CM | POA: Diagnosis not present

## 2024-07-03 NOTE — Assessment & Plan Note (Signed)
 Discussed need to remain on Suboxone  until sure of ability to get Vivitrol .  Patient agreeable.  Can also consider Sublocade  as another monthly injection.  Resources provided for Dr. Charlton, an addiction medicine specialist in [place]. - Continue Suboxone  daily

## 2024-07-03 NOTE — Progress Notes (Signed)
    SUBJECTIVE:   CHIEF COMPLAINT / HPI:   Aimee Guzman is a 38 y.o. female presenting to meet new PCP and discuss medical history.  Patient with known history of opioid use disorder currently in remission.  She does report a recent small slip up after an eye surgery this summer when she took opioids following this procedure. ToxAssure from 05/2024 positive for THC, Oxymorphone, Noroxycodone.  Per patient, she has not taken opioids since.  She is currently prescribed Suboxone  and has been self weaning this given her desire to switch to Vivitrol  and fear that being on Suboxone  would limit her ability to do so.  She has not taken Suboxone  in 2 days and denies current withdrawal symptoms. ToxAssure from 05/2024 was positive for buprenorphine /nor buprenorphine , indicating compliance with Suboxone . Wanting to switch to Vivitrol  for ease of taking (once a month shot vs daily pill of suboxone ).  She believes ToxAssure has been costing her around $80 each.  PERTINENT  PMH / PSH: Opioid use disorder, ADHD  OBJECTIVE:   BP 104/71   Pulse 76   Ht 5' 6 (1.676 m)   Wt 148 lb 6.4 oz (67.3 kg)   SpO2 100%   BMI 23.95 kg/m   General: Patient seated in chair, no acute distress. Cardiovascular: Regular rate and rhythm, no murmurs/rubs/gallops. Respiratory: Normal work of breathing on room air. Clear to auscultation bilaterally; no wheezes, crackles. Neuro: Alert and appropriately responding to questions.  ASSESSMENT/PLAN:   Assessment & Plan Opioid use disorder, mild, in sustained remission (HCC) Discussed need to remain on Suboxone  until sure of ability to get Vivitrol .  Patient agreeable.  Can also consider Sublocade  as another monthly injection.  Resources provided for Dr. Charlton, an addiction medicine specialist in [place]. - Continue Suboxone  daily   Will have patient follow-up in 3 months.  Alan Flies, MD [facility]

## 2024-07-03 NOTE — Patient Instructions (Addendum)
 Dear Aimee Guzman,   It was great seeing you in clinic today! You came in to meet your new PCP and go through your medical history. I think switching to Vivitrol  would be fine; however, I would caution against stopping Suboxone  until you have a known provider for this medication.  There are a few things for you to do outside of clinic: - Keep searching for a provider that can provide Vivitrol ; you can also consider Sublocade  (monthly buprenorphine  shot) as well - Continue to take your Suboxone  until you have an appointment as above! - Consider reaching out to Dr. Charlton, an addition medicine specialist in [place]: [email or web address] [phone]  Thank you for allowing me to be a part of your care team! Alan Flies, MD [facility] [address] [place], [place], [place] [address] [phone]

## 2024-07-10 ENCOUNTER — Other Ambulatory Visit: Payer: Self-pay

## 2024-07-10 DIAGNOSIS — F909 Attention-deficit hyperactivity disorder, unspecified type: Secondary | ICD-10-CM

## 2024-07-10 MED ORDER — AMPHETAMINE-DEXTROAMPHETAMINE 20 MG PO TABS: 20.0000 mg | ORAL_TABLET | Freq: Two times a day (BID) | ORAL | 0 refills | Status: DC

## 2024-08-02 ENCOUNTER — Other Ambulatory Visit: Payer: Self-pay

## 2024-08-02 DIAGNOSIS — F1111 Opioid abuse, in remission: Secondary | ICD-10-CM

## 2024-08-02 MED ORDER — BUPRENORPHINE HCL-NALOXONE HCL 4-1 MG SL FILM: 2.0000 mg | ORAL_FILM | Freq: Every day | SUBLINGUAL | 0 refills | Status: DC

## 2024-08-09 ENCOUNTER — Other Ambulatory Visit: Payer: Self-pay

## 2024-08-09 DIAGNOSIS — F909 Attention-deficit hyperactivity disorder, unspecified type: Secondary | ICD-10-CM

## 2024-08-09 MED ORDER — AMPHETAMINE-DEXTROAMPHETAMINE 20 MG PO TABS: 20.0000 mg | ORAL_TABLET | Freq: Two times a day (BID) | ORAL | 0 refills | Status: DC

## 2024-09-11 ENCOUNTER — Other Ambulatory Visit: Payer: Self-pay

## 2024-09-11 DIAGNOSIS — F1111 Opioid abuse, in remission: Secondary | ICD-10-CM

## 2024-09-11 DIAGNOSIS — F909 Attention-deficit hyperactivity disorder, unspecified type: Secondary | ICD-10-CM

## 2024-09-11 MED ORDER — BUPRENORPHINE HCL-NALOXONE HCL 4-1 MG SL FILM
2.0000 mg | ORAL_FILM | Freq: Every day | SUBLINGUAL | 0 refills | Status: AC
Start: 2024-09-11 — End: ?

## 2024-09-11 MED ORDER — AMPHETAMINE-DEXTROAMPHETAMINE 20 MG PO TABS: 20.0000 mg | ORAL_TABLET | Freq: Two times a day (BID) | ORAL | 0 refills | Status: DC

## 2024-09-12 DIAGNOSIS — F1121 Opioid dependence, in remission: Secondary | ICD-10-CM | POA: Diagnosis not present

## 2024-09-12 DIAGNOSIS — F909 Attention-deficit hyperactivity disorder, unspecified type: Secondary | ICD-10-CM | POA: Diagnosis not present

## 2024-10-09 ENCOUNTER — Other Ambulatory Visit: Payer: Self-pay

## 2024-10-09 DIAGNOSIS — F909 Attention-deficit hyperactivity disorder, unspecified type: Secondary | ICD-10-CM

## 2024-10-09 NOTE — Telephone Encounter (Signed)
 Patient recently seen by Dr. Con Gasman, who seems to be assuming care of patient per note. Per Dr. Johnathan note, he denied her Adderall refill given prior UDS not showing use and with concern for diversion. Will not refill Adderall at this time and will reach out to research officer, political party and clinic supervisor concerning possible switch in PCP.

## 2024-10-10 MED ORDER — AMPHETAMINE-DEXTROAMPHETAMINE 20 MG PO TABS: 20.0000 mg | ORAL_TABLET | Freq: Two times a day (BID) | ORAL | 0 refills | Status: AC

## 2024-10-10 NOTE — Telephone Encounter (Signed)
 Patient is still here at our practice. She has had a death in the family in [place]  and is needing her medication.   Adderall 20MG .   Please Advise.   Thanks!

## 2024-10-10 NOTE — Telephone Encounter (Signed)
 Patient returns call to nurse line. Advised that Dr. Larraine sent over two week supply of medication to pharmacy.   Scheduled her for follow up with PCP on 10/20/24.  Patient also reports that she is not seeing Dr. Marlee for primary care.   She reports that she is only seeing him for Vivitrol  or Sublocade  injections, given that our clinic does not administer these.   FYI to PCP.   Chiquita JAYSON English, RN

## 2024-10-15 DIAGNOSIS — K120 Recurrent oral aphthae: Secondary | ICD-10-CM | POA: Diagnosis not present

## 2024-10-17 DIAGNOSIS — R400 Somnolence: Secondary | ICD-10-CM | POA: Diagnosis not present

## 2024-10-17 DIAGNOSIS — F1121 Opioid dependence, in remission: Secondary | ICD-10-CM | POA: Diagnosis not present

## 2024-10-17 DIAGNOSIS — F909 Attention-deficit hyperactivity disorder, unspecified type: Secondary | ICD-10-CM | POA: Diagnosis not present

## 2024-10-20 ENCOUNTER — Ambulatory Visit

## 2024-11-02 ENCOUNTER — Other Ambulatory Visit: Payer: Self-pay | Admitting: Family Medicine

## 2024-11-02 DIAGNOSIS — F909 Attention-deficit hyperactivity disorder, unspecified type: Secondary | ICD-10-CM

## 2024-11-07 ENCOUNTER — Encounter: Payer: Self-pay | Admitting: Family Medicine

## 2024-11-07 NOTE — Telephone Encounter (Signed)
 Certified letter sent to patient per PCP's MyChart message. Letter handed to Burnard Gander to send as certified letter.

## 2024-11-14 ENCOUNTER — Ambulatory Visit
Admission: EM | Admit: 2024-11-14 | Discharge: 2024-11-14 | Disposition: A | Discharge status: Home / Self Care | Source: Home / Self Care | Attending: Family Medicine | Admitting: Family Medicine

## 2024-11-14 DIAGNOSIS — J019 Acute sinusitis, unspecified: Secondary | ICD-10-CM

## 2024-11-14 MED ORDER — ALBUTEROL SULFATE HFA 108 (90 BASE) MCG/ACT IN AERS: 2.0000 | INHALATION_SPRAY | RESPIRATORY_TRACT | 0 refills | Status: AC | PRN

## 2024-11-14 MED ORDER — BENZONATATE 100 MG PO CAPS: 100.0000 mg | ORAL_CAPSULE | Freq: Three times a day (TID) | ORAL | 0 refills | Status: AC | PRN

## 2024-11-14 MED ORDER — PREDNISONE 20 MG PO TABS: 40.0000 mg | ORAL_TABLET | Freq: Every day | ORAL | 0 refills | Status: AC

## 2024-11-14 MED ORDER — DOXYCYCLINE HYCLATE 100 MG PO CAPS: 100.0000 mg | ORAL_CAPSULE | Freq: Two times a day (BID) | ORAL | 0 refills | Status: AC

## 2024-11-14 NOTE — ED Triage Notes (Signed)
 Pt present coughing with congestion, symptoms started two weeks ago. Pt state the congestion is more in her chest and hurts to cough.

## 2024-11-14 NOTE — Discharge Instructions (Signed)
 Take doxycycline  100 mg --1 capsule 2 times daily for 7 days  Take prednisone  20 mg--2 daily for 5 days  Albuterol  inhaler--do 2 puffs every 4 hours as needed for shortness of breath or wheezing  Take benzonatate  100 mg, 1 tab every 8 hours as needed for cough.  Drink plenty of fluids

## 2024-11-14 NOTE — ED Provider Notes (Signed)
 [facility]    CSN: [id number] Arrival date & time: 11/14/24  1308      History   Chief Complaint Chief Complaint  Patient presents with   Cough    HPI Aimee Guzman is a 38 y.o. female.    Cough Here for cough and nasal congestion and chest congestion.  Symptoms began about 2 weeks ago.  She has not had any fever at any point.  She threw up once this morning but she feels like it was after some hard coughing and she had just blowing her nose.  She is no longer nauseated.  No diarrhea.  She has felt like she has congestion in her chest but has not been wheezing that she could tell.  No history of asthma or COPD  She is allergic to amoxicillin which makes her have a rash  Last menstrual cycle was a while ago.  She has an IUD    Past Medical History:  Diagnosis Date   Gestational diabetes    Gestational thrombocytopenia    HSV (herpes simplex virus) infection    Opioid use disorder    Vaginal Pap smear, abnormal     Patient Active Problem List   Diagnosis Date Noted   IUD (intrauterine device) in place 09/08/2022   Lyme disease 06/12/2022   Gestational diabetes mellitus (GDM) 02/02/2022   History of hepatitis C 02/02/2022   ADHD 01/22/2022   Anxiety 01/22/2022   Iron  deficiency anemia secondary to inadequate dietary iron  intake 04/10/2019   Gestational thrombocytopenia 04/10/2019   Opioid use disorder, mild, in sustained remission (HCC) 12/30/2018   Genital herpes simplex 12/26/2018    Past Surgical History:  Procedure Laterality Date   EYE SURGERY     LEEP     WRIST SURGERY      OB History     Gravida  3   Para  2   Term  2   Preterm      AB  1   Living  2      SAB  1   IAB      Ectopic      Multiple  0   Live Births  2            Home Medications    Prior to Admission medications  Medication Sig Start Date End Date Taking? Authorizing Provider  albuterol  (VENTOLIN  HFA) 108 (90 Base) MCG/ACT inhaler  Inhale 2 puffs into the lungs every 4 (four) hours as needed for wheezing or shortness of breath. 11/14/24  Yes Vonna Sharlet POUR, MD  benzonatate  (TESSALON ) 100 MG capsule Take 1 capsule (100 mg total) by mouth 3 (three) times daily as needed for cough. 11/14/24  Yes Vonna Sharlet POUR, MD  doxycycline  (VIBRAMYCIN ) 100 MG capsule Take 1 capsule (100 mg total) by mouth 2 (two) times daily for 7 days. 11/14/24 11/21/24 Yes Vonna Sharlet POUR, MD  predniSONE  (DELTASONE ) 20 MG tablet Take 2 tablets (40 mg total) by mouth daily with breakfast for 5 days. 11/14/24 11/19/24 Yes Vonna Sharlet POUR, MD  amphetamine -dextroamphetamine  (ADDERALL) 20 MG tablet Take 1 tablet (20 mg total) by mouth 2 (two) times daily. 10/10/24   Larraine Palma, MD  Buprenorphine  HCl-Naloxone  HCl 4-1 MG FILM Place 2-4 mg of opioid under the tongue daily. 09/11/24   Larraine Palma, MD  levonorgestrel  (LILETTA , 52 MG,) 20.1 MCG/DAY IUD IUD 1 each by Intrauterine route once.    [provider]    Family History Family  History  Problem Relation Age of Onset   Cancer Mother    Cancer Maternal Grandmother    Heart disease Maternal Grandfather     Social History Social History[1]   Allergies   Amoxicillin   Review of Systems Review of Systems  Respiratory:  Positive for cough.      Physical Exam Triage Vital Signs ED Triage Vitals  Encounter Vitals Group     BP 11/14/24 1326 108/72     Girls Systolic BP Percentile --      Girls Diastolic BP Percentile --      Boys Systolic BP Percentile --      Boys Diastolic BP Percentile --      Pulse Rate 11/14/24 1326 92     Resp 11/14/24 1326 16     Temp 11/14/24 1326 98 F (36.7 C)     Temp Source 11/14/24 1326 Oral     SpO2 11/14/24 1326 98 %     Weight --      Height --      Head Circumference --      Peak Flow --      Pain Score 11/14/24 1325 6     Pain Loc --      Pain Education --      Exclude from Growth Chart --    No data found.  Updated  Vital Signs BP 108/72 (BP Location: Left Arm)   Pulse 92   Temp 98 F (36.7 C) (Oral)   Resp 16   SpO2 98%   Visual Acuity Right Eye Distance:   Left Eye Distance:   Bilateral Distance:    Right Eye Near:   Left Eye Near:    Bilateral Near:     Physical Exam Vitals reviewed.  Constitutional:      General: She is not in acute distress.    Appearance: She is not ill-appearing, toxic-appearing or diaphoretic.  HENT:     Nose: Congestion present.     Mouth/Throat:     Mouth: Mucous membranes are moist.     Comments: There is lots of white and clear exudate draining in the posterior oropharynx Eyes:     Extraocular Movements: Extraocular movements intact.     Conjunctiva/sclera: Conjunctivae normal.     Pupils: Pupils are equal, round, and reactive to light.  Cardiovascular:     Rate and Rhythm: Normal rate and regular rhythm.     Heart sounds: No murmur heard. Pulmonary:     Effort: No respiratory distress.     Breath sounds: No stridor. No rhonchi or rales.     Comments: Breath sounds are coarse with fairly good air movement, but the expiratory phase might be a little prolonged.  Also think I hear a little bit of wheezing when she coughs. Musculoskeletal:     Cervical back: Neck supple.  Lymphadenopathy:     Cervical: No cervical adenopathy.  Skin:    Capillary Refill: Capillary refill takes less than 2 seconds.     Coloration: Skin is not jaundiced or pale.  Neurological:     General: No focal deficit present.     Mental Status: She is alert and oriented to person, place, and time.  Psychiatric:        Behavior: Behavior normal.      UC Treatments / Results  Labs (all labs ordered are listed, but only abnormal results are displayed) Labs Reviewed - No data to display  EKG   Radiology No results found.  Procedures Procedures (including critical care time)  Medications Ordered in UC Medications - No data to display  Initial Impression / Assessment  and Plan / UC Course  I have reviewed the triage vital signs and the nursing notes.  Pertinent labs & imaging results that were available during my care of the patient were reviewed by me and considered in my medical decision making (see chart for details).     Doxycycline  is sent in for the sinusitis.  Prednisone  is sent in due to what might be an asthma exacerbation, along with some Tessalon  Perles. Final Clinical Impressions(s) / UC Diagnoses   Final diagnoses:  Acute sinusitis, recurrence not specified, unspecified location     Discharge Instructions      Take doxycycline  100 mg --1 capsule 2 times daily for 7 days  Take prednisone  20 mg--2 daily for 5 days  Albuterol  inhaler--do 2 puffs every 4 hours as needed for shortness of breath or wheezing  Take benzonatate  100 mg, 1 tab every 8 hours as needed for cough.  Drink plenty of fluids     ED Prescriptions     Medication Sig Dispense Auth. Provider   doxycycline  (VIBRAMYCIN ) 100 MG capsule Take 1 capsule (100 mg total) by mouth 2 (two) times daily for 7 days. 14 capsule Demetri Kerman K, MD   predniSONE  (DELTASONE ) 20 MG tablet Take 2 tablets (40 mg total) by mouth daily with breakfast for 5 days. 10 tablet Vonna Sharlet POUR, MD   benzonatate  (TESSALON ) 100 MG capsule Take 1 capsule (100 mg total) by mouth 3 (three) times daily as needed for cough. 21 capsule Vonna Sharlet POUR, MD   albuterol  (VENTOLIN  HFA) 108 (90 Base) MCG/ACT inhaler Inhale 2 puffs into the lungs every 4 (four) hours as needed for wheezing or shortness of breath. 1 each Vonna Sharlet POUR, MD      PDMP not reviewed this encounter.    [1]  Social History Tobacco Use   Smoking status: Former    Current packs/day: 0.00    Types: Cigarettes    Quit date: 03/2020    Years since quitting: 4.6   Smokeless tobacco: Never  Vaping Use   Vaping status: Every Day   Substances: Nicotine, THC  Substance Use Topics   Alcohol use: Never   Drug  use: Yes    Types: Marijuana    Comment: last THC use last night, 04/07/22     Vonna Sharlet POUR, MD 11/14/24 1341
